# Patient Record
Sex: Female | Born: 1980 | Race: White | Hispanic: No | Marital: Married | Smoking: Never smoker
Health system: Southern US, Community
[De-identification: ages and names within clinical notes are randomized; demographics above are authoritative.]

## PROBLEM LIST (undated history)

## (undated) DIAGNOSIS — E039 Hypothyroidism, unspecified: Secondary | ICD-10-CM

## (undated) DIAGNOSIS — G43909 Migraine, unspecified, not intractable, without status migrainosus: Secondary | ICD-10-CM

## (undated) DIAGNOSIS — I10 Essential (primary) hypertension: Secondary | ICD-10-CM

## (undated) DIAGNOSIS — E079 Disorder of thyroid, unspecified: Secondary | ICD-10-CM

## (undated) HISTORY — DX: Disorder of thyroid, unspecified: E07.9

## (undated) HISTORY — DX: Hypothyroidism, unspecified: E03.9

## (undated) HISTORY — DX: Essential (primary) hypertension: I10

## (undated) HISTORY — DX: Migraine, unspecified, not intractable, without status migrainosus: G43.909

## (undated) HISTORY — PX: TUBAL LIGATION: SHX77

---

## 2005-02-16 ENCOUNTER — Encounter: Payer: Self-pay | Admitting: Unknown Physician Specialty

## 2005-03-31 ENCOUNTER — Inpatient Hospital Stay: Payer: Self-pay

## 2005-04-01 ENCOUNTER — Other Ambulatory Visit: Payer: Self-pay

## 2005-04-18 ENCOUNTER — Ambulatory Visit: Payer: Self-pay | Admitting: Urology

## 2005-06-07 ENCOUNTER — Inpatient Hospital Stay: Payer: Self-pay

## 2005-09-24 ENCOUNTER — Emergency Department: Payer: Self-pay | Admitting: Unknown Physician Specialty

## 2010-01-30 ENCOUNTER — Emergency Department: Payer: Self-pay

## 2012-12-12 ENCOUNTER — Ambulatory Visit: Payer: Self-pay | Admitting: Cardiovascular Disease

## 2013-01-15 ENCOUNTER — Encounter: Payer: Self-pay | Admitting: Cardiovascular Disease

## 2013-01-15 ENCOUNTER — Ambulatory Visit (INDEPENDENT_AMBULATORY_CARE_PROVIDER_SITE_OTHER): Payer: Managed Care, Other (non HMO) | Admitting: Cardiovascular Disease

## 2013-01-15 VITALS — BP 140/92 | HR 87 | Ht 64.0 in | Wt 190.8 lb

## 2013-01-15 DIAGNOSIS — R0789 Other chest pain: Secondary | ICD-10-CM

## 2013-01-15 DIAGNOSIS — I10 Essential (primary) hypertension: Secondary | ICD-10-CM | POA: Insufficient documentation

## 2013-01-15 DIAGNOSIS — G43909 Migraine, unspecified, not intractable, without status migrainosus: Secondary | ICD-10-CM

## 2013-01-15 DIAGNOSIS — R079 Chest pain, unspecified: Secondary | ICD-10-CM

## 2013-01-15 MED ORDER — NITROGLYCERIN 0.4 MG SL SUBL: 0.4000 mg | SUBLINGUAL_TABLET | SUBLINGUAL | Status: DC | PRN

## 2013-01-15 NOTE — Assessment & Plan Note (Signed)
Blood pressure well controlled on metoprolol.

## 2013-01-15 NOTE — Assessment & Plan Note (Signed)
On Topamax and beta blocker

## 2013-01-15 NOTE — Progress Notes (Signed)
   Patient ID: Jacqueline Walsh, female    DOB: 1980-12-06, 32 y.o.   MRN: [id number]  HPI Comments: Jacqueline Walsh is a very pleasant 32 year old woman with no prior cardiac history, patient of Dr. Juanetta Gosling, history of hypertension, migraines, hypothyroidism who presents by referral for chest pain.  Jacqueline Walsh is the mother of 3 boys. Significant stress at home, very active at baseline. She reports having a pressure in her mediastinal area radiating to shoulders that occurs 2 or 3 times per week lasting 10 minutes at a time with intensity of 8-9/10. Symptoms can present at rest, sometimes with exertion. Sometimes in the daytime, sometimes at nighttime. Prior to 3-4 months ago, she had no symptoms. She has not tried any medications to relieve her pain. In general she is very active and is unable to reproduce her pain with palpation, or activity.  At work, she is able to sit and has developed symptoms.  She does have a history of migraine symptoms. Recently they have been relatively well controlled on Topamax. Blood pressures also been well-controlled.  She denies any heartburn type symptoms he does not seem to appreciate lots of belching or burning after eating.  EKG shows normal sinus rhythm with rate 87 beats per minute, no significant ST or T wave changes   Outpatient Encounter Prescriptions as of 01/15/2013  Medication Sig Dispense Refill  . levothyroxine (SYNTHROID, LEVOTHROID) 137 MCG tablet Take 137 mcg by mouth daily before breakfast.      . metoprolol tartrate (LOPRESSOR) 25 MG tablet Take 25 mg by mouth 2 (two) times daily.      Marland Kitchen topiramate (TOPAMAX) 25 MG capsule Take 25 mg by mouth 2 (two) times daily.         Review of Systems  Constitutional: Negative.   HENT: Negative.   Eyes: Negative.   Respiratory: Negative.   Cardiovascular: Negative.   Gastrointestinal: Negative.   Musculoskeletal: Negative.   Skin: Negative.   Neurological: Negative.   Psychiatric/Behavioral: Negative.   All  other systems reviewed and are negative.    BP 140/92  Pulse 87  Ht 5\' 4"  (1.626 m)  Wt 190 lb 12 oz (86.524 kg)  BMI 32.73 kg/m2   Physical Exam  Nursing note and vitals reviewed. Constitutional: She is oriented to person, place, and time. She appears well-developed and well-nourished.  HENT:  Head: Normocephalic.  Nose: Nose normal.  Mouth/Throat: Oropharynx is clear and moist.  Eyes: Conjunctivae are normal. Pupils are equal, round, and reactive to light.  Neck: Normal range of motion. Neck supple. No JVD present.  Cardiovascular: Normal rate, regular rhythm, S1 normal, S2 normal, normal heart sounds and intact distal pulses.  Exam reveals no gallop and no friction rub.   No murmur heard. Pulmonary/Chest: Effort normal and breath sounds normal. No respiratory distress. She has no wheezes. She has no rales. She exhibits no tenderness.  Abdominal: Soft. Bowel sounds are normal. She exhibits no distension. There is no tenderness.  Musculoskeletal: Normal range of motion. She exhibits no edema and no tenderness.  Lymphadenopathy:    She has no cervical adenopathy.  Neurological: She is alert and oriented to person, place, and time. Coordination normal.  Skin: Skin is warm and dry. No rash noted. No erythema.  Psychiatric: She has a normal mood and affect. Her behavior is normal. Judgment and thought content normal.    Assessment and Plan

## 2013-01-15 NOTE — Assessment & Plan Note (Addendum)
Etiology of her chest pain is not clear. She is young, no significant risk factors for coronary artery disease. She's a nonsmoker, nondiabetic with no family history. Symptoms seem to present predominantly at rest, or intense though relatively frequent. Given her lack of reproducible symptoms with exertion, lack of risk factors, less inclined to think that this is angina or ischemia. More concerned about spasm (GI or coronary), GERD or other GI pathology. I suggested we try various things for symptom relief first before testing.  I suggested she try omeprazole twice a day for several weeks, with extra H2 blockers such as Zantac when necessary for symptoms. I've given her a prescription for nitroglycerin and suggested she try a half pill when necessary for possible spasm, coronary or esophageal. If this makes her migraines worse, we have asked her to hold the nitroglycerin. Also suggested she try carbonated beverages for possible hiatal hernia to see if she is able to belch to relieve her symptoms. It nitroglycerin does help, potentially calcium channel blockers or long-acting nitrates could be used daily.  Other options include trying levsin for her esophageal dysmotility or spasm.  Available testing detailed to her include treadmill testing, CT scan of the chest, possibly GI referral for EGD. We have asked her to call our office after she has tried several things as detailed above to see if symptoms will resolve.

## 2013-01-15 NOTE — Patient Instructions (Addendum)
For chest pressure, Please start omeprazole two a day If symptoms get better, back down to one a day  Ok to take generic pepcid/zantac as well. Try carbonation  For possible hiatal hernia  For possible spasm, try NTG (nitro) 1/2 pill under the tongue for severe symptoms  If symptoms persist, call the office Monitor symptoms to see if they happen with exertion  Please call us if you have new issues that need to be addressed before your next appt.

## 2016-09-19 ENCOUNTER — Emergency Department: Payer: Self-pay

## 2016-09-19 ENCOUNTER — Encounter: Payer: Self-pay | Admitting: Emergency Medicine

## 2016-09-19 DIAGNOSIS — R1011 Right upper quadrant pain: Secondary | ICD-10-CM | POA: Insufficient documentation

## 2016-09-19 DIAGNOSIS — I10 Essential (primary) hypertension: Secondary | ICD-10-CM | POA: Insufficient documentation

## 2016-09-19 DIAGNOSIS — E039 Hypothyroidism, unspecified: Secondary | ICD-10-CM | POA: Insufficient documentation

## 2016-09-19 DIAGNOSIS — Z79899 Other long term (current) drug therapy: Secondary | ICD-10-CM | POA: Insufficient documentation

## 2016-09-19 DIAGNOSIS — R1110 Vomiting, unspecified: Secondary | ICD-10-CM | POA: Insufficient documentation

## 2016-09-19 DIAGNOSIS — R079 Chest pain, unspecified: Secondary | ICD-10-CM | POA: Insufficient documentation

## 2016-09-19 LAB — BASIC METABOLIC PANEL
Anion gap: 9 (ref 5–15)
BUN: 10 mg/dL (ref 6–20)
CO2: 23 mmol/L (ref 22–32)
Calcium: 8.5 mg/dL — ABNORMAL LOW (ref 8.9–10.3)
Chloride: 104 mmol/L (ref 101–111)
Creatinine, Ser: 0.66 mg/dL (ref 0.44–1.00)
GFR calc Af Amer: >60 mL/min (ref >60)
GFR calc non Af Amer: >60 mL/min (ref >60)
GLUCOSE: 128 mg/dL — AB (ref 65–99)
Potassium: 3.8 mmol/L (ref 3.5–5.1)
Sodium: 136 mmol/L (ref 135–145)

## 2016-09-19 LAB — CBC
HCT: 39.7 % (ref 35.0–47.0)
Hemoglobin: 13.7 g/dL (ref 12.0–16.0)
MCH: 28.9 pg (ref 26.0–34.0)
MCHC: 34.4 g/dL (ref 32.0–36.0)
MCV: 84.2 fL (ref 80.0–100.0)
Platelets: 305 10*3/uL (ref 150–440)
RBC: 4.72 MIL/uL (ref 3.80–5.20)
RDW: 13.5 % (ref 11.5–14.5)
WBC: 12.4 10*3/uL — ABNORMAL HIGH (ref 3.6–11.0)

## 2016-09-19 LAB — TROPONIN I: Troponin I: <0.03 ng/mL (ref <0.03)

## 2016-09-19 NOTE — ED Triage Notes (Signed)
Pt ambulatory to triage with steady gait with c/o mid chest pain radiating to her back x 1 hour. Pt reports vomiting and headache earlier today. Pt denies hx of the same. Pt alert and oriented x 4, respirations even and unlabored, skin warm and dry,.

## 2016-09-20 ENCOUNTER — Emergency Department: Payer: Self-pay

## 2016-09-20 ENCOUNTER — Emergency Department
Admission: EM | Admit: 2016-09-20 | Discharge: 2016-09-20 | Disposition: A | Discharge status: Home / Self Care | Payer: Self-pay | Attending: Emergency Medicine | Admitting: Emergency Medicine

## 2016-09-20 DIAGNOSIS — R1011 Right upper quadrant pain: Secondary | ICD-10-CM

## 2016-09-20 DIAGNOSIS — R079 Chest pain, unspecified: Secondary | ICD-10-CM

## 2016-09-20 DIAGNOSIS — R1110 Vomiting, unspecified: Secondary | ICD-10-CM

## 2016-09-20 LAB — TROPONIN I

## 2016-09-20 NOTE — ED Provider Notes (Signed)
[facility] Emergency Department Provider Note    First MD Initiated Contact with Patient 09/20/16 0236     (approximate)  I have reviewed the triage vital signs and the nursing notes.   HISTORY  Chief Complaint Chest Pain   HPI Jacqueline Walsh is a 36 y.o. female with below list of chronic medical conditions presents to the emergency department with acute onset of vomiting tonight followed by chest pain and headache. Patient denies any symptoms at present stating that her chest pain was centrally located and described as pressure.   Past Medical History:  Diagnosis Date  . Hypertension   . Hypothyroidism   . Migraine, unspecified, without mention of intractable migraine without mention of status migrainosus   . Thyroid disease     Patient Active Problem List   Diagnosis Date Noted  . Chest pressure 01/15/2013  . Essential hypertension 01/15/2013  . Migraines 01/15/2013    Past Surgical History:  Procedure Laterality Date  . TUBAL LIGATION      Prior to Admission medications   Medication Sig Start Date End Date Taking? Authorizing Provider  levothyroxine (SYNTHROID, LEVOTHROID) 137 MCG tablet Take 137 mcg by mouth daily before breakfast.    Historical Provider, MD  metoprolol tartrate (LOPRESSOR) 25 MG tablet Take 25 mg by mouth 2 (two) times daily.    Historical Provider, MD  nitroGLYCERIN (NITROSTAT) 0.4 MG SL tablet Place 1 tablet (0.4 mg total) under the tongue every 5 (five) minutes as needed for chest pain. 01/15/13   Antonieta Iba, MD  topiramate (TOPAMAX) 25 MG capsule Take 25 mg by mouth 2 (two) times daily.    Historical Provider, MD    Allergies Codeine  Family History  Problem Relation Age of Onset  . Hypertension Mother   . Hyperlipidemia Mother     Social History Social History  Substance Use Topics  . Smoking status: Never Smoker  . Smokeless tobacco: Never Used  . Alcohol use No     Comment: occas    Review of  Systems Constitutional: No fever/chills Eyes: No visual changes. ENT: No sore throat. Cardiovascular: Positive for chest pain. Respiratory: Denies shortness of breath. Gastrointestinal: No abdominal pain.  No nausea, positive for vomiting Genitourinary: Negative for dysuria. Musculoskeletal: Negative for back pain. Skin: Negative for rash. Neurological: Negative for headaches, focal weakness or numbness.  10-point ROS otherwise negative.  ____________________________________________   PHYSICAL EXAM:  VITAL SIGNS: ED Triage Vitals [09/19/16 2320]  Enc Vitals Group     BP      Pulse      Resp      Temp      Temp src      SpO2      Weight 190 lb (86.2 kg)     Height 5\' 4"  (1.626 m)     Head Circumference      Peak Flow      Pain Score 8     Pain Loc      Pain Edu?      Excl. in GC?     Constitutional: Alert and oriented. Well appearing and in no acute distress. Eyes: Conjunctivae are normal. PERRL. EOMI. Head: Atraumatic. Mouth/Throat: Mucous membranes are moist.  Oropharynx non-erythematous. Neck: No stridor.   Cardiovascular: Normal rate, regular rhythm. Good peripheral circulation. Grossly normal heart sounds. Respiratory: Normal respiratory effort.  No retractions. Lungs CTAB. Gastrointestinal: Soft and nontender. No distention.  Musculoskeletal: No lower extremity tenderness nor edema. No gross  deformities of extremities. Neurologic:  Normal speech and language. No gross focal neurologic deficits are appreciated.  Skin:  Skin is warm, dry and intact. No rash noted. Psychiatric: Mood and affect are normal. Speech and behavior are normal.  ____________________________________________   LABS (all labs ordered are listed, but only abnormal results are displayed)  Labs Reviewed  BASIC METABOLIC PANEL - Abnormal; Notable for the following:       Result Value   Glucose, Bld 128 (*)    Calcium 8.5 (*)    All other components within normal limits  CBC - Abnormal;  Notable for the following:    WBC 12.4 (*)    All other components within normal limits  TROPONIN I  TROPONIN I   ____________________________________________  EKG  ED ECG REPORT I, Section N Elvert Cumpton, the attending physician, personally viewed and interpreted this ECG.   Date: 09/20/2016  EKG Time: 11:19 PM   Rate: 89  Rhythm: Normal sinus rhythm  Axis: Normal  Intervals: Normal  ST&T Change: None  ____________________________________________  RADIOLOGY I, Sharon N Prophet Renwick, personally viewed and evaluated these images (plain radiographs) as part of my medical decision making, as well as reviewing the written report by the radiologist.  Dg Chest 2 View  Result Date: 09/19/2016 CLINICAL DATA:  Acute onset of mid chest pain, radiating to the back. Vomiting and headache. Initial encounter. EXAM: CHEST  2 VIEW COMPARISON:  Chest radiograph performed 01/30/2010 FINDINGS: The lungs are well-aerated and clear. There is no evidence of focal opacification, pleural effusion or pneumothorax. The heart is normal in size; the mediastinal contour is within normal limits. No acute osseous abnormalities are seen. IMPRESSION: No acute cardiopulmonary process seen. Electronically Signed   By: Roanna RaiderJeffery  Chang M.D.   On: 09/19/2016 23:42      Procedures      INITIAL IMPRESSION / ASSESSMENT AND PLAN / ED COURSE  Pertinent labs & imaging results that were available during my care of the patient were reviewed by me and considered in my medical decision making (see chart for details).  36 year old female presenting with chest pain following vomiting. EKG revealed no evidence of ST segment elevation or depression. Troponin negative 2 laboratory data only remarkable for an elevated white count 12.4 ultrasound of the gallbladder revealed no gross pathology. Patient's chest pain possibly secondary to gastric contents irritating esophagus.      ____________________________________________  FINAL  CLINICAL IMPRESSION(S) / ED DIAGNOSES  Final diagnoses:  Vomiting  RUQ pain  Chest pain, unspecified type     MEDICATIONS GIVEN DURING THIS VISIT:  Medications - No data to display   NEW OUTPATIENT MEDICATIONS STARTED DURING THIS VISIT:  New Prescriptions   No medications on file    Modified Medications   No medications on file    Discontinued Medications   No medications on file     Note:  This document was prepared using Dragon voice recognition software and may include unintentional dictation errors.    Darci Currentandolph N Othel Hoogendoorn, MD 09/20/16 [phone]

## 2016-09-20 NOTE — ED Notes (Signed)
Pt. States headache, vomiting, then stabbing chest pain in central chest, radiating to back.

## 2016-09-20 NOTE — ED Notes (Signed)
MD Manson PasseyBrown at bedside,

## 2017-02-22 ENCOUNTER — Ambulatory Visit: Payer: Self-pay | Admitting: Family Medicine

## 2018-01-07 ENCOUNTER — Emergency Department: Payer: Medicaid Other

## 2018-01-07 ENCOUNTER — Emergency Department: Payer: Medicaid Other | Admitting: Registered Nurse

## 2018-01-07 ENCOUNTER — Encounter: Payer: Self-pay | Admitting: Emergency Medicine

## 2018-01-07 ENCOUNTER — Encounter
Admission: EM | Disposition: A | Discharge status: Home / Self Care | Payer: Self-pay | Source: Home / Self Care | Attending: Emergency Medicine

## 2018-01-07 ENCOUNTER — Observation Stay
Admission: EM | Admit: 2018-01-07 | Discharge: 2018-01-08 | Disposition: A | Discharge status: Home / Self Care | Payer: Medicaid Other | Attending: Surgery | Admitting: Surgery

## 2018-01-07 ENCOUNTER — Other Ambulatory Visit: Payer: Self-pay

## 2018-01-07 DIAGNOSIS — K3533 Acute appendicitis with perforation and localized peritonitis, with abscess: Principal | ICD-10-CM | POA: Insufficient documentation

## 2018-01-07 DIAGNOSIS — K37 Unspecified appendicitis: Secondary | ICD-10-CM | POA: Diagnosis present

## 2018-01-07 DIAGNOSIS — I10 Essential (primary) hypertension: Secondary | ICD-10-CM | POA: Diagnosis not present

## 2018-01-07 DIAGNOSIS — Z79899 Other long term (current) drug therapy: Secondary | ICD-10-CM | POA: Insufficient documentation

## 2018-01-07 DIAGNOSIS — Z885 Allergy status to narcotic agent status: Secondary | ICD-10-CM | POA: Diagnosis not present

## 2018-01-07 DIAGNOSIS — K3580 Unspecified acute appendicitis: Secondary | ICD-10-CM | POA: Diagnosis present

## 2018-01-07 DIAGNOSIS — E039 Hypothyroidism, unspecified: Secondary | ICD-10-CM | POA: Insufficient documentation

## 2018-01-07 HISTORY — PX: LAPAROSCOPIC APPENDECTOMY: SHX408

## 2018-01-07 LAB — CBC
HCT: 38.2 % (ref 35.0–47.0)
Hemoglobin: 13.2 g/dL (ref 12.0–16.0)
MCH: 29.6 pg (ref 26.0–34.0)
MCHC: 34.6 g/dL (ref 32.0–36.0)
MCV: 85.6 fL (ref 80.0–100.0)
PLATELETS: 260 10*3/uL (ref 150–440)
RBC: 4.46 MIL/uL (ref 3.80–5.20)
RDW: 13.7 % (ref 11.5–14.5)
WBC: 9.3 10*3/uL (ref 3.6–11.0)

## 2018-01-07 LAB — URINALYSIS, COMPLETE (UACMP) WITH MICROSCOPIC
Bilirubin Urine: NEGATIVE
GLUCOSE, UA: NEGATIVE mg/dL
KETONES UR: NEGATIVE mg/dL
NITRITE: NEGATIVE
PH: 8 (ref 5.0–8.0)
Protein, ur: 100 mg/dL — AB
Specific Gravity, Urine: 1.003 — ABNORMAL LOW (ref 1.005–1.030)

## 2018-01-07 LAB — COMPREHENSIVE METABOLIC PANEL
ALT: 15 U/L (ref 14–54)
AST: 22 U/L (ref 15–41)
Albumin: 4.2 g/dL (ref 3.5–5.0)
Alkaline Phosphatase: 79 U/L (ref 38–126)
Anion gap: 8 (ref 5–15)
BILIRUBIN TOTAL: 1.1 mg/dL (ref 0.3–1.2)
BUN: 9 mg/dL (ref 6–20)
CALCIUM: 9.1 mg/dL (ref 8.9–10.3)
CO2: 28 mmol/L (ref 22–32)
CREATININE: 0.65 mg/dL (ref 0.44–1.00)
Chloride: 102 mmol/L (ref 101–111)
Glucose, Bld: 91 mg/dL (ref 65–99)
Potassium: 3.6 mmol/L (ref 3.5–5.1)
Sodium: 138 mmol/L (ref 135–145)
TOTAL PROTEIN: 8.1 g/dL (ref 6.5–8.1)

## 2018-01-07 LAB — PREGNANCY, URINE: Preg Test, Ur: NEGATIVE

## 2018-01-07 LAB — LIPASE, BLOOD: Lipase: 24 U/L (ref 11–51)

## 2018-01-07 SURGERY — APPENDECTOMY, LAPAROSCOPIC
Anesthesia: General

## 2018-01-07 MED ORDER — HYDROMORPHONE HCL 1 MG/ML IJ SOLN
INTRAMUSCULAR | Status: DC | PRN
  Administered 2018-01-07 (×2): 0.5 mg via INTRAVENOUS

## 2018-01-07 MED ORDER — SUCCINYLCHOLINE CHLORIDE 20 MG/ML IJ SOLN
INTRAMUSCULAR | Status: AC
  Filled 2018-01-07: qty 1

## 2018-01-07 MED ORDER — KETOROLAC TROMETHAMINE 30 MG/ML IJ SOLN
30.0000 mg | Freq: Once | INTRAMUSCULAR | Status: AC
  Administered 2018-01-07: 30 mg via INTRAVENOUS

## 2018-01-07 MED ORDER — BUPIVACAINE-EPINEPHRINE (PF) 0.25% -1:200000 IJ SOLN
INTRAMUSCULAR | Status: AC
  Filled 2018-01-07: qty 30

## 2018-01-07 MED ORDER — OXYCODONE HCL 5 MG PO TABS
5.0000 mg | ORAL_TABLET | ORAL | Status: DC | PRN
  Administered 2018-01-08: 5 mg via ORAL
  Filled 2018-01-07: qty 2

## 2018-01-07 MED ORDER — KETOROLAC TROMETHAMINE 30 MG/ML IJ SOLN
INTRAMUSCULAR | Status: AC
  Filled 2018-01-07: qty 1

## 2018-01-07 MED ORDER — ONDANSETRON HCL 4 MG/2ML IJ SOLN
4.0000 mg | Freq: Once | INTRAMUSCULAR | Status: AC | PRN
  Administered 2018-01-07: 4 mg via INTRAVENOUS

## 2018-01-07 MED ORDER — ONDANSETRON HCL 4 MG/2ML IJ SOLN
INTRAMUSCULAR | Status: DC | PRN
  Administered 2018-01-07: 4 mg via INTRAVENOUS

## 2018-01-07 MED ORDER — ONDANSETRON HCL 4 MG/2ML IJ SOLN
4.0000 mg | Freq: Four times a day (QID) | INTRAMUSCULAR | Status: DC | PRN
Start: 2018-01-07 — End: 2018-01-08
  Filled 2018-01-07 (×2): qty 2

## 2018-01-07 MED ORDER — PROPOFOL 10 MG/ML IV BOLUS
INTRAVENOUS | Status: DC | PRN
  Administered 2018-01-07: 180 mg via INTRAVENOUS

## 2018-01-07 MED ORDER — ONDANSETRON HCL 4 MG/2ML IJ SOLN
INTRAMUSCULAR | Status: AC
  Administered 2018-01-07: 4 mg via INTRAVENOUS
  Filled 2018-01-07: qty 2

## 2018-01-07 MED ORDER — PIPERACILLIN-TAZOBACTAM 3.375 G IVPB 30 MIN
3.3750 g | Freq: Once | INTRAVENOUS | Status: AC
  Administered 2018-01-07: 3.375 g via INTRAVENOUS
  Filled 2018-01-07 (×2): qty 50

## 2018-01-07 MED ORDER — PROPOFOL 10 MG/ML IV BOLUS
INTRAVENOUS | Status: AC
  Filled 2018-01-07: qty 20

## 2018-01-07 MED ORDER — ENOXAPARIN SODIUM 40 MG/0.4ML ~~LOC~~ SOLN
40.0000 mg | SUBCUTANEOUS | Status: DC
  Administered 2018-01-08: 40 mg via SUBCUTANEOUS
  Filled 2018-01-07: qty 0.4

## 2018-01-07 MED ORDER — DIPHENHYDRAMINE HCL 25 MG PO CAPS
25.0000 mg | ORAL_CAPSULE | Freq: Four times a day (QID) | ORAL | Status: DC | PRN
  Administered 2018-01-07: 25 mg via ORAL
  Filled 2018-01-07: qty 1

## 2018-01-07 MED ORDER — ONDANSETRON 4 MG PO TBDP: 4.0000 mg | ORAL_TABLET | Freq: Four times a day (QID) | ORAL | Status: DC | PRN

## 2018-01-07 MED ORDER — ONDANSETRON HCL 4 MG/2ML IJ SOLN
INTRAMUSCULAR | Status: AC
  Filled 2018-01-07: qty 2

## 2018-01-07 MED ORDER — FENTANYL CITRATE (PF) 100 MCG/2ML IJ SOLN
INTRAMUSCULAR | Status: AC
  Filled 2018-01-07: qty 2

## 2018-01-07 MED ORDER — SODIUM CHLORIDE 0.9 % IV SOLN
2.0000 g | INTRAVENOUS | Status: DC
  Administered 2018-01-07: 2 g via INTRAVENOUS
  Filled 2018-01-07: qty 20
  Filled 2018-01-07: qty 2

## 2018-01-07 MED ORDER — SODIUM CHLORIDE 0.9 % IV BOLUS
500.0000 mL | Freq: Once | INTRAVENOUS | Status: AC
  Administered 2018-01-07: 500 mL via INTRAVENOUS

## 2018-01-07 MED ORDER — ONDANSETRON HCL 4 MG/2ML IJ SOLN
4.0000 mg | Freq: Once | INTRAMUSCULAR | Status: AC
  Administered 2018-01-07: 4 mg via INTRAVENOUS

## 2018-01-07 MED ORDER — PROCHLORPERAZINE EDISYLATE 10 MG/2ML IJ SOLN
10.0000 mg | Freq: Four times a day (QID) | INTRAMUSCULAR | Status: DC | PRN
  Administered 2018-01-07: 10 mg via INTRAVENOUS
  Filled 2018-01-07: qty 2

## 2018-01-07 MED ORDER — MIDAZOLAM HCL 2 MG/2ML IJ SOLN
INTRAMUSCULAR | Status: DC | PRN
  Administered 2018-01-07: 2 mg via INTRAVENOUS

## 2018-01-07 MED ORDER — HYDROMORPHONE HCL 1 MG/ML IJ SOLN: 0.5000 mg | INTRAMUSCULAR | Status: DC | PRN

## 2018-01-07 MED ORDER — ROCURONIUM BROMIDE 100 MG/10ML IV SOLN
INTRAVENOUS | Status: DC | PRN
  Administered 2018-01-07: 30 mg via INTRAVENOUS

## 2018-01-07 MED ORDER — FENTANYL CITRATE (PF) 100 MCG/2ML IJ SOLN
25.0000 ug | INTRAMUSCULAR | Status: AC | PRN
Start: 2018-01-07 — End: 2018-01-07
  Administered 2018-01-07 (×6): 25 ug via INTRAVENOUS

## 2018-01-07 MED ORDER — SUCCINYLCHOLINE CHLORIDE 20 MG/ML IJ SOLN
INTRAMUSCULAR | Status: DC | PRN
  Administered 2018-01-07: 80 mg via INTRAVENOUS

## 2018-01-07 MED ORDER — LIDOCAINE HCL (CARDIAC) PF 100 MG/5ML IV SOSY
PREFILLED_SYRINGE | INTRAVENOUS | Status: DC | PRN
  Administered 2018-01-07: 80 mg via INTRAVENOUS

## 2018-01-07 MED ORDER — METRONIDAZOLE IN NACL 5-0.79 MG/ML-% IV SOLN
500.0000 mg | Freq: Three times a day (TID) | INTRAVENOUS | Status: DC
  Administered 2018-01-08 (×2): 500 mg via INTRAVENOUS
  Filled 2018-01-07 (×4): qty 100

## 2018-01-07 MED ORDER — PANTOPRAZOLE SODIUM 40 MG PO TBEC
40.0000 mg | DELAYED_RELEASE_TABLET | Freq: Every day | ORAL | Status: DC
  Administered 2018-01-08: 40 mg via ORAL
  Filled 2018-01-07: qty 1

## 2018-01-07 MED ORDER — DEXAMETHASONE SODIUM PHOSPHATE 10 MG/ML IJ SOLN
INTRAMUSCULAR | Status: DC | PRN
  Administered 2018-01-07: 10 mg via INTRAVENOUS

## 2018-01-07 MED ORDER — LIDOCAINE HCL (PF) 2 % IJ SOLN
INTRAMUSCULAR | Status: AC
  Filled 2018-01-07: qty 10

## 2018-01-07 MED ORDER — MIDAZOLAM HCL 2 MG/2ML IJ SOLN
INTRAMUSCULAR | Status: AC
  Filled 2018-01-07: qty 2

## 2018-01-07 MED ORDER — ONDANSETRON HCL 4 MG/2ML IJ SOLN
INTRAMUSCULAR | Status: AC
Start: 2018-01-07 — End: 2018-01-08
  Filled 2018-01-07: qty 2

## 2018-01-07 MED ORDER — FENTANYL CITRATE (PF) 100 MCG/2ML IJ SOLN
INTRAMUSCULAR | Status: AC
  Administered 2018-01-07: 25 ug via INTRAVENOUS
  Filled 2018-01-07: qty 2

## 2018-01-07 MED ORDER — ROCURONIUM BROMIDE 100 MG/10ML IV SOLN
INTRAVENOUS | Status: AC
  Filled 2018-01-07: qty 1

## 2018-01-07 MED ORDER — SODIUM CHLORIDE 0.9 % IV BOLUS
1000.0000 mL | Freq: Once | INTRAVENOUS | Status: AC
  Administered 2018-01-07: 450 mL via INTRAVENOUS
  Administered 2018-01-07: 1000 mL via INTRAVENOUS

## 2018-01-07 MED ORDER — FENTANYL CITRATE (PF) 100 MCG/2ML IJ SOLN
INTRAMUSCULAR | Status: DC | PRN
  Administered 2018-01-07: 100 ug via INTRAVENOUS

## 2018-01-07 MED ORDER — DEXAMETHASONE SODIUM PHOSPHATE 10 MG/ML IJ SOLN
INTRAMUSCULAR | Status: AC
  Filled 2018-01-07: qty 1

## 2018-01-07 MED ORDER — SUGAMMADEX SODIUM 200 MG/2ML IV SOLN
INTRAVENOUS | Status: DC | PRN
  Administered 2018-01-07: 2 mg via INTRAVENOUS

## 2018-01-07 MED ORDER — HYDROMORPHONE HCL 1 MG/ML IJ SOLN
INTRAMUSCULAR | Status: AC
  Filled 2018-01-07: qty 1

## 2018-01-07 MED ORDER — LACTATED RINGERS IV SOLN
INTRAVENOUS | Status: DC
  Administered 2018-01-07: 23:00:00 via INTRAVENOUS

## 2018-01-07 MED ORDER — MORPHINE SULFATE (PF) 4 MG/ML IV SOLN: 2.0000 mg | INTRAVENOUS | Status: DC | PRN

## 2018-01-07 MED ORDER — BUPIVACAINE-EPINEPHRINE 0.25% -1:200000 IJ SOLN
INTRAMUSCULAR | Status: DC | PRN
  Administered 2018-01-07: 30 mL

## 2018-01-07 MED ORDER — KETOROLAC TROMETHAMINE 30 MG/ML IJ SOLN
30.0000 mg | Freq: Four times a day (QID) | INTRAMUSCULAR | Status: DC | PRN
  Administered 2018-01-08: 30 mg via INTRAVENOUS
  Filled 2018-01-07: qty 1

## 2018-01-07 MED ORDER — MORPHINE SULFATE (PF) 4 MG/ML IV SOLN
4.0000 mg | Freq: Once | INTRAVENOUS | Status: AC
  Administered 2018-01-07: 4 mg via INTRAVENOUS
  Filled 2018-01-07: qty 1

## 2018-01-07 MED ORDER — ACETAMINOPHEN 500 MG PO TABS
1000.0000 mg | ORAL_TABLET | Freq: Four times a day (QID) | ORAL | Status: DC
  Administered 2018-01-08 (×2): 1000 mg via ORAL
  Filled 2018-01-07 (×2): qty 2

## 2018-01-07 MED ORDER — SUGAMMADEX SODIUM 200 MG/2ML IV SOLN
INTRAVENOUS | Status: AC
  Filled 2018-01-07: qty 2

## 2018-01-07 SURGICAL SUPPLY — 37 items
APPLIER CLIP 5 13 M/L LIGAMAX5 (MISCELLANEOUS)
BLADE CLIPPER SURG (BLADE) ×3 IMPLANT
CANISTER SUCT 1200ML W/VALVE (MISCELLANEOUS) ×3 IMPLANT
CHLORAPREP W/TINT 26ML (MISCELLANEOUS) ×3 IMPLANT
CLIP APPLIE 5 13 M/L LIGAMAX5 (MISCELLANEOUS) IMPLANT
CUTTER FLEX LINEAR 45M (STAPLE) ×3 IMPLANT
DERMABOND ADVANCED (GAUZE/BANDAGES/DRESSINGS) ×2
DERMABOND ADVANCED .7 DNX12 (GAUZE/BANDAGES/DRESSINGS) ×1 IMPLANT
ELECT CAUTERY BLADE 6.4 (BLADE) ×3 IMPLANT
ELECT REM PT RETURN 9FT ADLT (ELECTROSURGICAL) ×3
ELECTRODE REM PT RTRN 9FT ADLT (ELECTROSURGICAL) ×1 IMPLANT
GLOVE BIO SURGEON STRL SZ7 (GLOVE) ×9 IMPLANT
GOWN STRL REUS W/ TWL LRG LVL3 (GOWN DISPOSABLE) ×2 IMPLANT
GOWN STRL REUS W/TWL LRG LVL3 (GOWN DISPOSABLE) ×4
IRRIGATION STRYKERFLOW (MISCELLANEOUS) ×1 IMPLANT
IRRIGATOR STRYKERFLOW (MISCELLANEOUS) ×3
IV NS 1000ML (IV SOLUTION) ×2
IV NS 1000ML BAXH (IV SOLUTION) ×1 IMPLANT
NEEDLE HYPO 22GX1.5 SAFETY (NEEDLE) ×3 IMPLANT
NS IRRIG 500ML POUR BTL (IV SOLUTION) ×3 IMPLANT
PACK LAP CHOLECYSTECTOMY (MISCELLANEOUS) ×3 IMPLANT
PENCIL ELECTRO HAND CTR (MISCELLANEOUS) ×3 IMPLANT
POUCH SPECIMEN RETRIEVAL 10MM (ENDOMECHANICALS) ×3 IMPLANT
RELOAD 45 VASCULAR/THIN (ENDOMECHANICALS) IMPLANT
RELOAD STAPLE TA45 3.5 REG BLU (ENDOMECHANICALS) ×3 IMPLANT
SCISSORS METZENBAUM CVD 33 (INSTRUMENTS) IMPLANT
SHEARS HARMONIC ACE PLUS 36CM (ENDOMECHANICALS) ×3 IMPLANT
SLEEVE ENDOPATH XCEL 5M (ENDOMECHANICALS) ×3 IMPLANT
SLEEVE SCD COMPRESS THIGH MED (MISCELLANEOUS) ×3 IMPLANT
SPONGE LAP 18X18 RF (DISPOSABLE) ×3 IMPLANT
SUT MNCRL AB 4-0 PS2 18 (SUTURE) ×3 IMPLANT
SUT VICRYL 0 AB UR-6 (SUTURE) ×6 IMPLANT
SYR 20CC LL (SYRINGE) ×3 IMPLANT
TRAY FOLEY MTR SLVR 16FR STAT (SET/KITS/TRAYS/PACK) IMPLANT
TROCAR XCEL BLUNT TIP 100MML (ENDOMECHANICALS) ×3 IMPLANT
TROCAR XCEL NON-BLD 5MMX100MML (ENDOMECHANICALS) ×3 IMPLANT
TUBING INSUF HEATED (TUBING) ×3 IMPLANT

## 2018-01-07 NOTE — Transfer of Care (Signed)
Immediate Anesthesia Transfer of Care Note  Patient: Jacqueline Walsh  Procedure(s) Performed: APPENDECTOMY LAPAROSCOPIC (N/A )  Patient Location: PACU  Anesthesia Type:General  Level of Consciousness: awake  Airway & Oxygen Therapy: Patient Spontanous Breathing  Post-op Assessment: Report given to RN  Post vital signs: stable  Last Vitals:  Vitals Value Taken Time  BP 149/98 01/07/2018  9:19 PM  Temp    Pulse 94 01/07/2018  9:20 PM  Resp 19 01/07/2018  9:20 PM  SpO2 99 % 01/07/2018  9:20 PM  Vitals shown include unvalidated device data.  Last Pain:  Vitals:   01/07/18 2010  TempSrc:   PainSc: 9          Complications: No apparent anesthesia complications

## 2018-01-07 NOTE — ED Notes (Signed)
Pt ambulatory to toilet. Will administer medications when pt returns.

## 2018-01-07 NOTE — ED Notes (Signed)
Pharmacy at bedside

## 2018-01-07 NOTE — ED Notes (Addendum)
Last eating/drinking 11:30 today.  Pt getting undressed at this time, husband at bedside.

## 2018-01-07 NOTE — ED Triage Notes (Signed)
Pt reports that she developed mid lower abd pain that she has been nauseated with since yesterday. Denies any V/D. States that she took Ibuprofen but it has not helped.

## 2018-01-07 NOTE — ED Notes (Signed)
Pt c/o of RLQ abd pain and nausea. Denies vomiting or having diarrhea. Alert, oriented, appears uncomfortable. Symptoms began yesterday.

## 2018-01-07 NOTE — Anesthesia Preprocedure Evaluation (Signed)
Anesthesia Evaluation  Patient identified by MRN, date of birth, ID band Patient awake    Reviewed: Allergy & Precautions, NPO status , Patient's Chart, lab work & pertinent test results  History of Anesthesia Complications Negative for: history of anesthetic complications  Airway Mallampati: III       Dental   Pulmonary neg sleep apnea, neg COPD,           Cardiovascular hypertension (non-compliant with meds), (-) Past MI and (-) CHF (-) dysrhythmias (-) Valvular Problems/Murmurs     Neuro/Psych neg Seizures    GI/Hepatic Neg liver ROS, neg GERD  ,  Endo/Other  neg diabetesHypothyroidism   Renal/GU negative Renal ROS     Musculoskeletal   Abdominal   Peds  Hematology   Anesthesia Other Findings   Reproductive/Obstetrics                             Anesthesia Physical Anesthesia Plan  ASA: II and emergent  Anesthesia Plan:    Post-op Pain Management:    Induction:   PONV Risk Score and Plan: 2 and Ondansetron and Dexamethasone  Airway Management Planned: Oral ETT  Additional Equipment:   Intra-op Plan:   Post-operative Plan:   Informed Consent: I have reviewed the patients History and Physical, chart, labs and discussed the procedure including the risks, benefits and alternatives for the proposed anesthesia with the patient or authorized representative who has indicated his/her understanding and acceptance.     Plan Discussed with:   Anesthesia Plan Comments:         Anesthesia Quick Evaluation

## 2018-01-07 NOTE — Op Note (Signed)
laparascopic appendectomy   [name] Date of operation:  01/07/2018  Indications: The patient presented with a history of  abdominal pain. Workup has revealed findings consistent with acute appendicitis.  Pre-operative Diagnosis: Acute appendicitis without mention of peritonitis  Post-operative Diagnosis: Same  Surgeon: Sterling Big, MD, FACS  Anesthesia: General with endotracheal tube  Findings: Acute non perforated appendicitis  Estimated Blood Loss: 10cc         Specimens: appendix         Complications:  none  Procedure Details  The patient was seen again in the preop area. The options of surgery versus observation were reviewed with the patient and/or family. The risks of bleeding, infection, recurrence of symptoms, negative laparoscopy, potential for an open procedure, bowel injury, abscess or infection, were all reviewed as well. The patient was taken to Operating Room, identified as Jacqueline Walsh and the procedure verified as laparoscopic appendectomy. A Time Out was held and the above information confirmed.  The patient was placed in the supine position and general anesthesia was induced.  Antibiotic prophylaxis was administered and VT E prophylaxis was in place. A Foley catheter was placed by the nursing staff.   The abdomen was prepped and draped in a sterile fashion. An infraumbilical incision was made. A cutdown technique was used to enter the abdominal cavity. Two vicryl stitches were placed on the fascia and a Hasson trocar inserted. Pneumoperitoneum obtained. Two 5 mm ports were placed under direct visualization.   The appendix was identified and found to be acutely inflamed  The appendix was carefully dissected. The mesoappendix was divided withHarmonic scalpel. The base of the appendix was dissected out and divided with a standard load Endo GIA.The appendix was placed in a Endo Catch bag and removed via the Hasson port. The right lower quadrant and pelvis was then  irrigated with  normal saline which was aspirated. Inspection  failed to identify any additional bleeding and there were no signs of bowel injury. Again the right lower quadrant was inspected there was no sign of bleeding or bowel injury therefore pneumoperitoneum was released, all ports were removed.  The umbilical fascia was closed with 0 Vicryl interrupted sutures and the skin incisions were approximated with subcuticular 4-0 Monocryl. Dermabond was placed The patient tolerated the procedure well, there were no complications. The sponge lap and needle count were correct at the end of the procedure.  The patient was taken to the recovery room in stable condition to be admitted for continued care.    Sterling Big, MD FACS

## 2018-01-07 NOTE — ED Provider Notes (Addendum)
[facility] Emergency Department Provider Note  ____________________________________________   I have reviewed the triage vital signs and the nursing notes. Where available I have reviewed prior notes and, if possible and indicated, outside hospital notes.    HISTORY  Chief Complaint Abdominal Pain    HPI Jacqueline Walsh is a 37 y.o. female history of hypertension, thyroid disease, migraines, as well as kidney stones in the past presents today with gradual onset right sided abdominal pain which radiates down towards her groin.  Nothing makes it better nothing makes worse nausea but no vomiting no fever no chills no dysuria no diarrhea no hematuria.  She had a BTL she states approximately eyes pregnancy.  Patient has had no vaginal discharge, she states that she has had any stones before which felt similar but not quite exactly like this".  She denies any other alleviating or aggravating symptoms no other antecedent treatment.  Describes the pain is significant.     Past Medical History:  Diagnosis Date  . Hypertension   . Hypothyroidism   . Migraine, unspecified, without mention of intractable migraine without mention of status migrainosus   . Thyroid disease     Patient Active Problem List   Diagnosis Date Noted  . Chest pressure 01/15/2013  . Essential hypertension 01/15/2013  . Migraines 01/15/2013    Past Surgical History:  Procedure Laterality Date  . TUBAL LIGATION      Prior to Admission medications   Medication Sig Start Date End Date Taking? Authorizing Provider  levothyroxine (SYNTHROID, LEVOTHROID) 137 MCG tablet Take 137 mcg by mouth daily before breakfast.    [provider]  metoprolol tartrate (LOPRESSOR) 25 MG tablet Take 25 mg by mouth 2 (two) times daily.    [provider]  nitroGLYCERIN (NITROSTAT) 0.4 MG SL tablet Place 1 tablet (0.4 mg total) under the tongue every 5 (five) minutes as needed for chest pain.  01/15/13   Antonieta Iba, MD  topiramate (TOPAMAX) 25 MG capsule Take 25 mg by mouth 2 (two) times daily.    [provider]    Allergies Codeine  Family History  Problem Relation Age of Onset  . Hypertension Mother   . Hyperlipidemia Mother     Social History Social History   Tobacco Use  . Smoking status: Never Smoker  . Smokeless tobacco: Never Used  Substance Use Topics  . Alcohol use: No    Comment: occas  . Drug use: No    Review of Systems Constitutional: No fever/chills Eyes: No visual changes. ENT: No sore throat. No stiff neck no neck pain Cardiovascular: Denies chest pain. Respiratory: Denies shortness of breath. Gastrointestinal:   no vomiting.  No diarrhea.  No constipation. Genitourinary: Negative for dysuria. Musculoskeletal: Negative lower extremity swelling Skin: Negative for rash. Neurological: Negative for severe headaches, focal weakness or numbness.   ____________________________________________   PHYSICAL EXAM:  VITAL SIGNS: ED Triage Vitals  Enc Vitals Group     BP 01/07/18 1633 (!) 187/108     Pulse Rate 01/07/18 1633 94     Resp 01/07/18 1633 20     Temp 01/07/18 1633 98.6 F (37 C)     Temp Source 01/07/18 1633 Oral     SpO2 01/07/18 1633 98 %     Weight 01/07/18 1634 190 lb (86.2 kg)     Height 01/07/18 1634  (1.626 m)     Head Circumference --      Peak Flow --  Pain Score 01/07/18 1633 9     Pain Loc --      Pain Edu? --      Excl. in GC? --     Constitutional: Alert and oriented. Well appearing and in no acute distress. Eyes: Conjunctivae are normal Head: Atraumatic HEENT: No congestion/rhinnorhea. Mucous membranes are moist.  Oropharynx non-erythematous Neck:   Nontender with no meningismus, no masses, no stridor Cardiovascular: Normal rate, regular rhythm. Grossly normal heart sounds.  Good peripheral circulation. Respiratory: Normal respiratory effort.  No retractions. Lungs CTAB. Abdominal:  Soft and right-sided mid to lower abdominal discomfort. No distention. No guarding no rebound Back:  There is no focal tenderness or step off.  there is no midline tenderness there are no lesions noted. there is no CVA tenderness  Musculoskeletal: No lower extremity tenderness, no upper extremity tenderness. No joint effusions, no DVT signs strong distal pulses no edema Neurologic:  Normal speech and language. No gross focal neurologic deficits are appreciated.  Skin:  Skin is warm, dry and intact. No rash noted. Psychiatric: Mood and affect are normal. Speech and behavior are normal.  ____________________________________________   LABS (all labs ordered are listed, but only abnormal results are displayed)  Labs Reviewed  URINALYSIS, COMPLETE (UACMP) WITH MICROSCOPIC - Abnormal; Notable for the following components:      Result Value   Color, Urine RED (*)    APPearance CLOUDY (*)    Specific Gravity, Urine 1.003 (*)    Hgb urine dipstick LARGE (*)    Protein, ur 100 (*)    Leukocytes, UA MODERATE (*)    Bacteria, UA MANY (*)    All other components within normal limits  LIPASE, BLOOD  COMPREHENSIVE METABOLIC PANEL  CBC  PREGNANCY, URINE  POC URINE PREG, ED    Pertinent labs  results that were available during my care of the patient were reviewed by me and considered in my medical decision making (see chart for details). ____________________________________________  EKG  I personally interpreted any EKGs ordered by me or triage  ____________________________________________  RADIOLOGY  Pertinent labs & imaging results that were available during my care of the patient were reviewed by me and considered in my medical decision making (see chart for details). If possible, patient and/or family made aware of any abnormal findings.  No results found. ____________________________________________    PROCEDURES  Procedure(s) performed: None  Procedures  Critical Care  performed: None  ____________________________________________   INITIAL IMPRESSION / ASSESSMENT AND PLAN / ED COURSE  Pertinent labs & imaging results that were available during my care of the patient were reviewed by me and considered in my medical decision making (see chart for details).  Patient here with right-sided fairly abrupt onset pain, history of kidney stones, positive hematuria although she is on her menses at this time.  We will start with a CT scan to rule out stone and reassess.  We will give her pain medications and IV fluid.  No vomiting but will give her nausea medication.  Appendicitis is considered, white count however is normal, no vomiting no fever  ----------------------------------------- 7:49 PM on 01/07/2018 -----------------------------------------  Findings consistent with appendicitis, we are giving Zosyn I have discussed with Dr. Elenor Legato of surgery they will come evaluate the patient.    ____________________________________________   FINAL CLINICAL IMPRESSION(S) / ED DIAGNOSES  Final diagnoses:  None      This chart was dictated using voice recognition software.  Despite best efforts to proofread,  errors can occur  which can change meaning.      Jeanmarie Plant, MD 01/07/18 1925    Jeanmarie Plant, MD 01/07/18 1949

## 2018-01-07 NOTE — H&P (Signed)
Patient ID: Jacqueline Walsh, female   DOB: [birth year], 37 y.o.   MRN: [id number]  HPI Jacqueline Walsh is a 37 y.o. female him into the emergency room with a 24-hour history of abdominal pain.  Pain is located in the right lower quadrant and radiates to right groin.  She reports that the pain is moderate to severe intensity, sharp and worsening when she moves.  He does have associated nausea and decreased appetite.  No emesis no fevers no chills. Only operation is tubal ligation.  She is able to perform more than 4 METS of activity without any shortness of breath or chest pain. Count is normal creatinine is normal CT scan personally reviewed there is evidence of dilated appendix with some inflammatory component.  There is no free air or abscess U preg Neg. Lytes nml. HPI  Past Medical History:  Diagnosis Date  . Hypertension   . Hypothyroidism   . Migraine, unspecified, without mention of intractable migraine without mention of status migrainosus   . Thyroid disease     Past Surgical History:  Procedure Laterality Date  . TUBAL LIGATION      Family History  Problem Relation Age of Onset  . Hypertension Mother   . Hyperlipidemia Mother     Social History Social History   Tobacco Use  . Smoking status: Never Smoker  . Smokeless tobacco: Never Used  Substance Use Topics  . Alcohol use: No    Comment: occas  . Drug use: No    Allergies  Allergen Reactions  . Codeine     Hives Breathing difficulty    Current Facility-Administered Medications  Medication Dose Route Frequency Provider Last Rate Last Dose  . morphine 4 MG/ML injection 4 mg  4 mg Intravenous Once Jeanmarie Plant, MD      . piperacillin-tazobactam (ZOSYN) IVPB 3.375 g  3.375 g Intravenous Once Ileana Roup A, MD      . sodium chloride 0.9 % bolus 1,000 mL  1,000 mL Intravenous Once Leonela Kivi, [place] F, MD      . sodium chloride 0.9 % bolus 500 mL  500 mL Intravenous Once Jeanmarie Plant, MD 492 mL/hr at 01/07/18 1912  500 mL at 01/07/18 1912   Current Outpatient Medications  Medication Sig Dispense Refill  . LEVOTHYROXINE SODIUM PO Take by mouth every day (Monday through Friday) and take by mouth daily Saturday and Sunday       Review of Systems Full ROS  was asked and was negative except for the information on the HPI  Physical Exam Blood pressure (!) 170/116, pulse 89, temperature 98.6 F (37 C), temperature source Oral, resp. rate 18, height  (1.626 m), weight 86.2 kg (190 lb), SpO2 99 %. CONSTITUTIONAL: NAD, EYES: Pupils are equal, round, and reactive to light, Sclera are non-icteric. EARS, NOSE, MOUTH AND THROAT: The oropharynx is clear. The oral mucosa is pink and moist. Hearing is intact to voice. LYMPH NODES:  Lymph nodes in the neck are normal. RESPIRATORY:  Lungs are clear. There is normal respiratory effort, with equal breath sounds bilaterally, and without pathologic use of accessory muscles. CARDIOVASCULAR: Heart is regular without murmurs, gallops, or rubs. GI: The abdomen is  Soft,TTP RLQ w some rebound. Focal peritonitis. GU: Rectal deferred.   MUSCULOSKELETAL: Normal muscle strength and tone. No cyanosis or edema.   SKIN: Turgor is good and there are no pathologic skin lesions or ulcers. NEUROLOGIC: Motor and sensation is grossly normal. Cranial nerves  are grossly intact. PSYCH:  Oriented to person, place and time. Affect is normal.  Data Reviewed  I have personally reviewed the patient's imaging, laboratory findings and medical records.    Assessment/Plan 37 year old female with acute abdominal pain consistent with acute appendicitis and confirmed by CT. Discussed with the patient detail about her disease process and I do recommend appendectomy. The risks, benefits, complications, treatment options, and expected outcomes were discussed with the patient. .  Also discussed continuing to the operating room for Laparoscopic Appendectomy.  The possibilities of   bleeding, recurrent infection, perforation of viscus, finding a normal appendix, the need for additional procedures, failure to diagnose a condition, conversion to open procedure and creating a complication requiring transfusion or further operations were discussed. The patient was given the opportunity to ask questions and have them answered.  Patient would like to proceed with Laparoscopic Appendectomy and consent was obtained.  We will go ahead and start broad-spectrum antibiotics, crystalloids and post her for an appendectomy tonight    Sterling Big, MD FACS General Surgeon 01/07/2018, 8:05 PM

## 2018-01-07 NOTE — Anesthesia Post-op Follow-up Note (Signed)
Anesthesia QCDR form completed.        

## 2018-01-07 NOTE — Anesthesia Postprocedure Evaluation (Signed)
Anesthesia Post Note  Patient: Jacqueline Walsh  Procedure(s) Performed: APPENDECTOMY LAPAROSCOPIC (N/A )  Patient location during evaluation: PACU Anesthesia Type: General Level of consciousness: awake and alert Pain management: pain level controlled Vital Signs Assessment: post-procedure vital signs reviewed and stable Respiratory status: spontaneous breathing and respiratory function stable Cardiovascular status: stable Anesthetic complications: no     Last Vitals:  Vitals:   01/07/18 2231 01/07/18 2300  BP: (!) 158/98 (!) 152/96  Pulse: 83 82  Resp: 19 18  Temp: 36.6 C 36.4 C  SpO2: 95% 96%    Last Pain:  Vitals:   01/07/18 2300  TempSrc: Oral  PainSc:                  Daquawn Seelman K

## 2018-01-07 NOTE — Anesthesia Procedure Notes (Signed)
Procedure Name: Intubation Date/Time: 01/07/2018 8:32 PM Performed by: Geraldine Contras, CRNA Pre-anesthesia Checklist: Patient identified, Emergency Drugs available, Suction available, Timeout performed and Patient being monitored Patient Re-evaluated:Patient Re-evaluated prior to induction Oxygen Delivery Method: Circle system utilized Preoxygenation: Pre-oxygenation with 100% oxygen Induction Type: IV induction Ventilation: Mask ventilation without difficulty Laryngoscope Size: Mac and 3 Grade View: Grade II Tube type: Oral Tube size: 7.0 mm Number of attempts: 2 Airway Equipment and Method: Stylet Placement Confirmation: positive ETCO2 and breath sounds checked- equal and bilateral Secured at: 22 cm Tube secured with: Tape Dental Injury: Teeth and Oropharynx as per pre-operative assessment

## 2018-01-07 NOTE — ED Notes (Signed)
Surgeon at bedside. Pt signed OR consent.

## 2018-01-08 ENCOUNTER — Encounter: Payer: Self-pay | Admitting: Surgery

## 2018-01-08 MED ORDER — OXYCODONE HCL 5 MG PO TABS: 5.0000 mg | ORAL_TABLET | ORAL | 0 refills | Status: DC | PRN

## 2018-01-08 NOTE — Discharge Instructions (Signed)
In addition to included general post-operative instructions for Laparoscopic Appendectomy,  Diet: Resume home heart healthy diet.   Activity: No heavy lifting >20 pounds (children, pets, laundry, garbage) or strenuous activity until follow-up, but light activity and walking are encouraged. Do not drive or drink alcohol if taking narcotic pain medications.  Wound care: 2 days after surgery (Friday, 5/24), you may shower/get incision wet with soapy water and pat dry (do not rub incisions), but no baths or submerging incision underwater until follow-up.   Medications: Resume all home medications. For mild to moderate pain: acetaminophen (Tylenol) or ibuprofen/naproxen (if no kidney disease). Combining Tylenol with alcohol can substantially increase your risk of causing liver disease. Narcotic pain medications, if prescribed, can be used for severe pain, though may cause nausea, constipation, and drowsiness. Do not combine Tylenol and Percocet (or similar) within a 6 hour period as Percocet (and similar) contain(s) Tylenol. If you do not need the narcotic pain medication, you do not need to fill the prescription.  Call office [phone]) at any time if any questions, worsening pain, fevers/chills, bleeding, drainage from incision site, or other concerns.

## 2018-01-08 NOTE — Progress Notes (Signed)
Patient cleared for discharge by Dr Earlene Plater. Education complete. AVS printed. Discharge instructions given. All questions answered for patient clarification.  Prescriptions given, pharmacy verified.  IV removed.  Discharged to home with husband Via POV

## 2018-01-09 LAB — SURGICAL PATHOLOGY

## 2018-01-09 LAB — HIV ANTIBODY (ROUTINE TESTING W REFLEX): HIV SCREEN 4TH GENERATION: NONREACTIVE

## 2018-01-20 DIAGNOSIS — E063 Autoimmune thyroiditis: Secondary | ICD-10-CM | POA: Insufficient documentation

## 2018-01-21 ENCOUNTER — Encounter: Payer: Self-pay | Admitting: Surgery

## 2018-01-21 ENCOUNTER — Ambulatory Visit (INDEPENDENT_AMBULATORY_CARE_PROVIDER_SITE_OTHER): Payer: Self-pay | Admitting: Surgery

## 2018-01-21 VITALS — BP 192/121 | HR 91 | Temp 98.4°F | Ht 64.0 in | Wt 185.0 lb

## 2018-01-21 DIAGNOSIS — Z09 Encounter for follow-up examination after completed treatment for conditions other than malignant neoplasm: Secondary | ICD-10-CM

## 2018-01-21 NOTE — Patient Instructions (Signed)
We scheduled an appointment with [facility]  due to your elevated blood pressure. Please see address below.  [facility]                                              [address], [place], [place] [id number]      GENERAL POST-OPERATIVE PATIENT INSTRUCTIONS   WOUND CARE INSTRUCTIONS:  Keep a dry clean dressing on the wound if there is drainage. The initial bandage may be removed after 24 hours.  Once the wound has quit draining you may leave it open to air.  If clothing rubs against the wound or causes irritation and the wound is not draining you may cover it with a dry dressing during the daytime.  Try to keep the wound dry and avoid ointments on the wound unless directed to do so.  If the wound becomes bright red and painful or starts to drain infected material that is not clear, please contact your physician immediately.  If the wound is mildly pink and has a thick firm ridge underneath it, this is normal, and is referred to as a healing ridge.  This will resolve over the next 4-6 weeks.  BATHING: You may shower if you have been informed of this by your surgeon. However, Please do not submerge in a tub, hot tub, or pool until incisions are completely sealed or have been told by your surgeon that you may do so.  DIET:  You may eat any foods that you can tolerate.  It is a good idea to eat a high fiber diet and take in plenty of fluids to prevent constipation.  If you do become constipated you may want to take a mild laxative or take ducolax tablets on a daily basis until your bowel habits are regular.  Constipation can be very uncomfortable, along with straining, after recent surgery.  ACTIVITY:  You are encouraged to cough and deep breath or use your incentive spirometer if you were given one, every 15-30 minutes when awake.  This will help prevent respiratory complications and low grade fevers post-operatively if you had a general anesthetic.  You may want to hug a  pillow when coughing and sneezing to add additional support to the surgical area, if you had abdominal or chest surgery, which will decrease pain during these times.  You are encouraged to walk and engage in light activity for the next two weeks.  You should not lift more than 20 pounds, until 02/18/2018 as it could put you at increased risk for complications.  Twenty pounds is roughly equivalent to a plastic bag of groceries. At that time- Listen to your body when lifting, if you have pain when lifting, stop and then try again in a few days. Soreness after doing exercises or activities of daily living is normal as you get back in to your normal routine.  MEDICATIONS:  Try to take narcotic medications and anti-inflammatory medications, such as tylenol, ibuprofen, naprosyn, etc., with food.  This will minimize stomach upset from the medication.  Should you develop nausea and vomiting from the pain medication, or develop a rash, please discontinue the medication and contact your physician.  You should not drive, make important decisions, or operate machinery when taking narcotic pain medication.  SUNBLOCK Use sun block to incision area over the next year if  this area will be exposed to sun. This helps decrease scarring and will allow you avoid a permanent darkened area over your incision.  QUESTIONS:  Please feel free to call our office if you have any questions, and we will be glad to assist you. [phone]

## 2018-01-22 ENCOUNTER — Encounter: Payer: Self-pay | Admitting: Surgery

## 2018-01-22 NOTE — Progress Notes (Signed)
S/p lap appy No complaints Taking PO, no fevers,  Minimal pain Main issue is HTN Path d/w pt  PE: NAD Abd: incisions c/d/i, soft nt  A/P Doing well No surgical issues Arrange PCP for HTN RTC prn No heavy lifting

## 2018-01-23 ENCOUNTER — Inpatient Hospital Stay: Payer: Self-pay | Admitting: Surgery

## 2018-02-17 ENCOUNTER — Other Ambulatory Visit: Payer: Self-pay

## 2018-02-17 ENCOUNTER — Ambulatory Visit: Payer: Self-pay | Admitting: Nurse Practitioner

## 2018-02-17 ENCOUNTER — Encounter: Payer: Self-pay | Admitting: Nurse Practitioner

## 2018-02-17 VITALS — BP 160/81 | HR 83 | Temp 98.7°F | Ht 64.0 in | Wt 187.2 lb

## 2018-02-17 DIAGNOSIS — E063 Autoimmune thyroiditis: Secondary | ICD-10-CM

## 2018-02-17 DIAGNOSIS — Z7689 Persons encountering health services in other specified circumstances: Secondary | ICD-10-CM

## 2018-02-17 DIAGNOSIS — I10 Essential (primary) hypertension: Secondary | ICD-10-CM

## 2018-02-17 MED ORDER — LISINOPRIL 10 MG PO TABS: 10.0000 mg | ORAL_TABLET | Freq: Every day | ORAL | 1 refills | Status: DC

## 2018-02-17 NOTE — Progress Notes (Signed)
Subjective:    Patient ID: Jacqueline EllisMegan B Archuleta, female    DOB: 28-Jun-1981, 37 y.o.   MRN: [id number]  Jacqueline Walsh is a 37 y.o. female presenting on 02/17/2018 for Establish Care and Hypertension   HPI Establish Care New Provider  Pt last seen by PCP [facility] in 2017 years ago.  [facility]    Hypertension  - She is not checking BP at home or outside of clinic.    - Last medications: lisinopril 10 mg once daily, tolerating well without side effects, but has been off for several months. - She is not currently symptomatic. - Pt denies headache, lightheadedness, dizziness, changes in vision, chest tightness/pressure, palpitations, leg swelling, sudden loss of speech or loss of consciousness. - She  reports no regular exercise routine. - Her diet is moderate in salt, moderate in fat, and moderate in carbohydrates.   Hypothyroidism - Pt states she is taking her levothyroxine 140 mcg M-female and 137 mcg on Sat-Sun in the am at least 1 hour before eating or drinking and taking other medicines.   - She is not currently symptomatic. - She denies, fatigue, excess energy, weight changes, heart racing, heart palpitations, heat and cold intolerance, changes in hair/skin/nails, and lower leg swelling.  - She does not have any compressive symptoms to include difficulty swallowing, globus sensation, or difficulty breathing when lying flat.   Past Medical History:  Diagnosis Date  . Hypertension   . Hypothyroidism   . Migraine, unspecified, without mention of intractable migraine without mention of status migrainosus   . Thyroid disease    Past Surgical History:  Procedure Laterality Date  . LAPAROSCOPIC APPENDECTOMY N/A 01/07/2018   Procedure: APPENDECTOMY LAPAROSCOPIC;  Surgeon: Leafy RoPabon, Diego F, MD;  Location: [facility] ORS;  Service: General;  Laterality: N/A;  . TUBAL LIGATION     Social History   Socioeconomic History  . Marital status: Married    Spouse  name: Not on file  . Number of children: Not on file  . Years of education: Not on file  . Highest education level: Not on file  Occupational History  . Not on file  Social Needs  . Financial resource strain: Not on file  . Food insecurity:    Worry: Not on file    Inability: Not on file  . Transportation needs:    Medical: Not on file    Non-medical: Not on file  Tobacco Use  . Smoking status: Never Smoker  . Smokeless tobacco: Never Used  Substance and Sexual Activity  . Alcohol use: No    Comment: occas  . Drug use: No  . Sexual activity: Yes  Lifestyle  . Physical activity:    Days per week: Not on file    Minutes per session: Not on file  . Stress: Not on file  Relationships  . Social connections:    Talks on phone: Not on file    Gets together: Not on file    Attends religious service: Not on file    Active member of club or organization: Not on file    Attends meetings of clubs or organizations: Not on file    Relationship status: Not on file  . Intimate partner violence:    Fear of current or ex partner: Not on file    Emotionally abused: Not on file    Physically abused: Not on file    Forced sexual activity: Not on file  Other Topics  Concern  . Not on file  Social History Narrative  . Not on file   Family History  Problem Relation Age of Onset  . Hypertension Mother   . Hyperlipidemia Mother   . Thyroid disease Father   . Thyroid disease Sister    Current Outpatient Medications on File Prior to Visit  Medication Sig  . LEVOTHYROXINE SODIUM PO Take by mouth every day (Monday through Friday) and take by mouth daily Saturday and Sunday  . Multiple Vitamin (MULTIVITAMIN) tablet Take 1 tablet by mouth daily.   No current facility-administered medications on file prior to visit.     Review of Systems  Constitutional: Negative for chills and fever.  HENT: Negative for congestion and sore throat.   Eyes: Negative for pain.  Respiratory:  Negative for cough, shortness of breath and wheezing.   Cardiovascular: Negative for chest pain, palpitations and leg swelling.  Gastrointestinal: Negative for abdominal pain, blood in stool, constipation, diarrhea, nausea and vomiting.  Endocrine: Negative for polydipsia.  Genitourinary: Negative for dysuria, frequency, hematuria and urgency.  Musculoskeletal: Negative for back pain, myalgias and neck pain.  Skin: Negative.  Negative for rash.  Allergic/Immunologic: Negative for environmental allergies.  Neurological: Negative for dizziness, weakness and headaches.  Hematological: Does not bruise/bleed easily.  Psychiatric/Behavioral: Negative for dysphoric mood and suicidal ideas. The patient is not nervous/anxious.    Per HPI unless specifically indicated above     Objective:    BP (!) 160/81 (BP Location: Right Arm, Patient Position: Sitting, Cuff Size: Normal)   Pulse 83   Temp 98.7 F (37.1 C) (Oral)   Ht 5\' 4"  (1.626 m)   Wt 187 lb 3.2 oz (84.9 kg)   LMP 02/03/2018   BMI 32.13 kg/m   Wt Readings from Last 3 Encounters:  02/17/18 187 lb 3.2 oz (84.9 kg)  01/21/18 185 lb (83.9 kg)  01/07/18 190 lb (86.2 kg)    Physical Exam  Constitutional: She is oriented to person, place, and time. She appears well-developed and well-nourished. No distress.  HENT:  Head: Normocephalic and atraumatic.  Neck: Normal range of motion. Neck supple. Carotid bruit is not present. No thyromegaly present.  Cardiovascular: Normal rate, regular rhythm, S1 normal, S2 normal, normal heart sounds and intact distal pulses.  Pulmonary/Chest: Effort normal and breath sounds normal. No respiratory distress.  Musculoskeletal: She exhibits no edema (pedal).  Neurological: She is alert and oriented to person, place, and time.  Skin: Skin is warm and dry. Capillary refill takes less than 2 seconds.  Psychiatric: She has a normal mood and affect. Her behavior is normal. Judgment and thought content normal.    Vitals reviewed.  Results for orders placed or performed in visit on 02/17/18  COMPLETE METABOLIC PANEL WITH GFR  Result Value Ref Range   Glucose, Bld 92 65 - 99 mg/dL   BUN 11 7 - 25 mg/dL   Creat 1.30 8.65 - 7.84 mg/dL   GFR, Est Non African American 108 > OR = 60 mL/min/1.73m2   GFR, Est African American 126 > OR = 60 mL/min/1.37m2   BUN/Creatinine Ratio NOT APPLICABLE 6 - 22 (calc)   Sodium 138 135 - 146 mmol/L   Potassium 4.0 3.5 - 5.3 mmol/L   Chloride 106 98 - 110 mmol/L   CO2 25 20 - 32 mmol/L   Calcium 8.9 8.6 - 10.2 mg/dL   Total Protein 7.0 6.1 - 8.1 g/dL   Albumin 4.5 3.6 - 5.1 g/dL  Globulin 2.5 1.9 - 3.7 g/dL (calc)   AG Ratio 1.8 1.0 - 2.5 (calc)   Total Bilirubin 0.7 0.2 - 1.2 mg/dL   Alkaline phosphatase (APISO) 75 33 - 115 U/L   AST 17 10 - 30 U/L   ALT 14 6 - 29 U/L  TSH + free T4  Result Value Ref Range   TSH W/REFLEX TO FT4 2.53 mIU/L      Assessment & Plan:   Problem List Items Addressed This Visit      Cardiovascular and Mediastinum   Essential hypertension - Primary    Uncontrolled, but is off medication.  Previously took lisinopril and tolerated well.  BP goal < 130/80.  Pt is not currently working on lifestyle modifications.  Taking medications tolerating well without side effects.   Plan: 1. Continue taking lisinopril 10 mg once daily 2. Obtain labs CMP, TSH today  3. Encouraged heart healthy diet and increasing exercise to 30 minutes most days of the week. 4. Check BP 1-2 x per week at home, keep log, and bring to clinic at next appointment. 5. Follow up 3 months.        Relevant Medications   lisinopril (PRINIVIL,ZESTRIL) 10 MG tablet   Other Relevant Orders   COMPLETE METABOLIC PANEL WITH GFR (Completed)     Endocrine   Hashimoto's disease    No recent check of hypothyroidism.  Stable at last labs.  Patient taking levothyroxine correctly.  Repeat labs today before refills provided.  Followup 3 months.      Relevant Orders   TSH  + free T4 (Completed)    Other Visit Diagnoses    Encounter to establish care         Previous PCP was at [facility]  Records are reviewed in CareEverywhere.  Past medical, family, and surgical history reviewed w/ patient in clinic today.   Meds ordered this encounter  Medications  . lisinopril (PRINIVIL,ZESTRIL) 10 MG tablet    Sig: Take 1 tablet (10 mg total) by mouth daily.    Dispense:  90 tablet    Refill:  1    Order Specific Question:   Supervising Provider    Answer:   Smitty Cords [[id number]]    Follow up plan: Return in about 3 months (around 05/20/2018) for AND in 2 weeks for BP check with CMA, hypertension.  Wilhelmina Mcardle, DNP, AGPCNP-BC Adult Gerontology Primary Care Nurse Practitioner [facility] 02/17/2018, 10:36 AM

## 2018-02-17 NOTE — Patient Instructions (Addendum)
Jacqueline Walsh,   Thank you for coming in to clinic today.  1. START lisinopril 10 mg once daily.  Discussed possible side effects of angioedema (rare), cough (common and reversible), kidney damage (rare, increase monitoring with start).  2. Reduce sodium/salt intake in your diet. - Increase your physical activity until you are increasing your heart rate for 30 minutes on most days of the week.  Please schedule a follow-up appointment with Wilhelmina Mcardle, AGNP. Return in about 3 months (around 05/20/2018) for AND in 2 weeks for BP check with CMA, hypertension.  If you have any other questions or concerns, please feel free to call the clinic or send a message through MyChart. You may also schedule an earlier appointment if necessary.  You will receive a survey after today's visit either digitally by e-mail or paper by [employer]. Your experiences and feedback matter to [place].  Please respond so we know how we are doing as we provide care for you.   Wilhelmina Mcardle, DNP, AGNP-BC Adult Gerontology Nurse Practitioner [facility], [facility]   Managing Your Hypertension Hypertension is commonly called high blood pressure. This is when the force of your blood pressing against the walls of your arteries is too strong. Arteries are blood vessels that carry blood from your heart throughout your body. Hypertension forces the heart to work harder to pump blood, and may cause the arteries to become narrow or stiff. Having untreated or uncontrolled hypertension can cause heart attack, stroke, kidney disease, and other problems. What are blood pressure readings? A blood pressure reading consists of a higher number over a lower number. Ideally, your blood pressure should be below 120/80. The first ("top") number is called the systolic pressure. It is a measure of the pressure in your arteries as your heart beats. The second ("bottom") number is called the diastolic pressure. It is a measure of the  pressure in your arteries as the heart relaxes. What does my blood pressure reading mean? Blood pressure is classified into four stages. Based on your blood pressure reading, your health care provider may use the following stages to determine what type of treatment you need, if any. Systolic pressure and diastolic pressure are measured in a unit called mm Hg. Normal  Systolic pressure: below 120.  Diastolic pressure: below 80. Elevated  Systolic pressure: 120-129.  Diastolic pressure: below 80. Hypertension stage 1  Systolic pressure: 130-139.  Diastolic pressure: 80-89. Hypertension stage 2  Systolic pressure: 140 or above.  Diastolic pressure: 90 or above. What health risks are associated with hypertension? Managing your hypertension is an important responsibility. Uncontrolled hypertension can lead to:  A heart attack.  A stroke.  A weakened blood vessel (aneurysm).  Heart failure.  Kidney damage.  Eye damage.  Metabolic syndrome.  Memory and concentration problems.  What changes can I make to manage my hypertension? Hypertension can be managed by making lifestyle changes and possibly by taking medicines. Your health care provider will help you make a plan to bring your blood pressure within a normal range. Eating and drinking  Eat a diet that is high in fiber and potassium, and low in salt (sodium), added sugar, and fat. An example eating plan is called the DASH (Dietary Approaches to Stop Hypertension) diet. To eat this way: ? Eat plenty of fresh fruits and vegetables. Try to fill half of your plate at each meal with fruits and vegetables. ? Eat whole grains, such as whole wheat pasta, brown rice, or  whole grain bread. Fill about one quarter of your plate with whole grains. ? Eat low-fat diary products. ? Avoid fatty cuts of meat, processed or cured meats, and poultry with skin. Fill about one quarter of your plate with lean proteins such as fish, chicken  without skin, beans, eggs, and tofu. ? Avoid premade and processed foods. These tend to be higher in sodium, added sugar, and fat.  Reduce your daily sodium intake. Most people with hypertension should eat less than 1,500 mg of sodium a day.  Limit alcohol intake to no more than 1 drink a day for nonpregnant women and 2 drinks a day for men. One drink equals 12 oz of beer, 5 oz of wine, or 1 oz of hard liquor. Lifestyle  Work with your health care provider to maintain a healthy body weight, or to lose weight. Ask what an ideal weight is for you.  Get at least 30 minutes of exercise that causes your heart to beat faster (aerobic exercise) most days of the week. Activities may include walking, swimming, or biking.  Include exercise to strengthen your muscles (resistance exercise), such as weight lifting, as part of your weekly exercise routine. Try to do these types of exercises for 30 minutes at least 3 days a week.  Do not use any products that contain nicotine or tobacco, such as cigarettes and e-cigarettes. If you need help quitting, ask your health care provider.  Control any long-term (chronic) conditions you have, such as high cholesterol or diabetes. Monitoring  Monitor your blood pressure at home as told by your health care provider. Your personal target blood pressure may vary depending on your medical conditions, your age, and other factors.  Have your blood pressure checked regularly, as often as told by your health care provider. Working with your health care provider  Review all the medicines you take with your health care provider because there may be side effects or interactions.  Talk with your health care provider about your diet, exercise habits, and other lifestyle factors that may be contributing to hypertension.  Visit your health care provider regularly. Your health care provider can help you create and adjust your plan for managing hypertension. Will I need  medicine to control my blood pressure? Your health care provider may prescribe medicine if lifestyle changes are not enough to get your blood pressure under control, and if:  Your systolic blood pressure is 130 or higher.  Your diastolic blood pressure is 80 or higher.  Take medicines only as told by your health care provider. Follow the directions carefully. Blood pressure medicines must be taken as prescribed. The medicine does not work as well when you skip doses. Skipping doses also puts you at risk for problems. Contact a health care provider if:  You think you are having a reaction to medicines you have taken.  You have repeated (recurrent) headaches.  You feel dizzy.  You have swelling in your ankles.  You have trouble with your vision. Get help right away if:  You develop a severe headache or confusion.  You have unusual weakness or numbness, or you feel faint.  You have severe pain in your chest or abdomen.  You vomit repeatedly.  You have trouble breathing. Summary  Hypertension is when the force of blood pumping through your arteries is too strong. If this condition is not controlled, it may put you at risk for serious complications.  Your personal target blood pressure may vary depending on your medical  conditions, your age, and other factors. For most people, a normal blood pressure is less than 120/80.  Hypertension is managed by lifestyle changes, medicines, or both. Lifestyle changes include weight loss, eating a healthy, low-sodium diet, exercising more, and limiting alcohol. This information is not intended to replace advice given to you by your health care provider. Make sure you discuss any questions you have with your health care provider. Document Released: 04/30/2012 Document Revised: 07/04/2016 Document Reviewed: 07/04/2016 Elsevier Interactive Patient Education  [employer].

## 2018-02-18 LAB — COMPLETE METABOLIC PANEL WITH GFR
AG Ratio: 1.8 (calc) (ref 1.0–2.5)
ALT: 14 U/L (ref 6–29)
AST: 17 U/L (ref 10–30)
Albumin: 4.5 g/dL (ref 3.6–5.1)
Alkaline phosphatase (APISO): 75 U/L (ref 33–115)
BUN: 11 mg/dL (ref 7–25)
CO2: 25 mmol/L (ref 20–32)
Calcium: 8.9 mg/dL (ref 8.6–10.2)
Chloride: 106 mmol/L (ref 98–110)
Creat: 0.72 mg/dL (ref 0.50–1.10)
GFR, Est African American: 126 mL/min/{1.73_m2} (ref >=60)
GFR, Est Non African American: 108 mL/min/{1.73_m2} (ref >=60)
Globulin: 2.5 g/dL (calc) (ref 1.9–3.7)
Glucose, Bld: 92 mg/dL (ref 65–99)
Potassium: 4 mmol/L (ref 3.5–5.3)
Sodium: 138 mmol/L (ref 135–146)
Total Bilirubin: 0.7 mg/dL (ref 0.2–1.2)
Total Protein: 7 g/dL (ref 6.1–8.1)

## 2018-02-18 LAB — TSH+FREE T4: TSH W/REFLEX TO FT4: 2.53 mIU/L

## 2018-02-19 ENCOUNTER — Other Ambulatory Visit: Payer: Self-pay | Admitting: Nurse Practitioner

## 2018-02-19 DIAGNOSIS — E063 Autoimmune thyroiditis: Secondary | ICD-10-CM

## 2018-02-19 MED ORDER — LEVOTHYROXINE SODIUM 137 MCG PO TABS: 137.0000 ug | ORAL_TABLET | ORAL | 1 refills | Status: DC

## 2018-02-19 MED ORDER — LEVOTHYROXINE SODIUM 150 MCG PO TABS: 150.0000 ug | ORAL_TABLET | ORAL | 1 refills | Status: DC

## 2018-03-03 ENCOUNTER — Ambulatory Visit (INDEPENDENT_AMBULATORY_CARE_PROVIDER_SITE_OTHER): Payer: Self-pay

## 2018-03-03 VITALS — BP 134/74 | Resp 16 | Ht 64.0 in | Wt 187.2 lb

## 2018-03-03 DIAGNOSIS — I10 Essential (primary) hypertension: Secondary | ICD-10-CM

## 2018-03-03 NOTE — Progress Notes (Signed)
BP much improved.  Continue with regularly scheduled followup in 3 months.

## 2018-05-19 ENCOUNTER — Encounter: Payer: Self-pay | Admitting: Nurse Practitioner

## 2018-05-19 NOTE — Assessment & Plan Note (Signed)
No recent check of hypothyroidism.  Stable at last labs.  Patient taking levothyroxine correctly.  Repeat labs today before refills provided.  Followup 3 months.

## 2018-05-19 NOTE — Assessment & Plan Note (Signed)
Uncontrolled, but is off medication.  Previously took lisinopril and tolerated well.  BP goal < 130/80.  Pt is not currently working on lifestyle modifications.  Taking medications tolerating well without side effects.   Plan: 1. Continue taking lisinopril 10 mg once daily 2. Obtain labs CMP, TSH today  3. Encouraged heart healthy diet and increasing exercise to 30 minutes most days of the week. 4. Check BP 1-2 x per week at home, keep log, and bring to clinic at next appointment. 5. Follow up 3 months.

## 2018-05-20 ENCOUNTER — Ambulatory Visit: Payer: Self-pay | Admitting: Nurse Practitioner

## 2018-05-22 ENCOUNTER — Ambulatory Visit: Payer: Self-pay | Admitting: Nurse Practitioner

## 2018-05-26 ENCOUNTER — Ambulatory Visit: Payer: Self-pay | Admitting: Nurse Practitioner

## 2018-07-07 ENCOUNTER — Ambulatory Visit: Payer: Self-pay | Admitting: Nurse Practitioner

## 2018-07-07 ENCOUNTER — Encounter: Payer: Self-pay | Admitting: Nurse Practitioner

## 2018-07-07 ENCOUNTER — Other Ambulatory Visit: Payer: Self-pay

## 2018-07-07 VITALS — BP 121/74 | HR 75 | Temp 98.6°F | Ht 64.0 in | Wt 188.4 lb

## 2018-07-07 DIAGNOSIS — I10 Essential (primary) hypertension: Secondary | ICD-10-CM

## 2018-07-07 DIAGNOSIS — E063 Autoimmune thyroiditis: Secondary | ICD-10-CM

## 2018-07-07 DIAGNOSIS — J0140 Acute pansinusitis, unspecified: Secondary | ICD-10-CM

## 2018-07-07 MED ORDER — LISINOPRIL 10 MG PO TABS: 10.0000 mg | ORAL_TABLET | Freq: Every day | ORAL | 1 refills | Status: DC

## 2018-07-07 MED ORDER — AMOXICILLIN-POT CLAVULANATE 875-125 MG PO TABS: 1.0000 | ORAL_TABLET | Freq: Two times a day (BID) | ORAL | 0 refills | Status: DC

## 2018-07-07 MED ORDER — HYDRALAZINE HCL 10 MG PO TABS: 10.0000 mg | ORAL_TABLET | Freq: Three times a day (TID) | ORAL | 1 refills | Status: DC | PRN

## 2018-07-07 NOTE — Patient Instructions (Addendum)
Jacqueline Walsh,   Thank you for coming in to clinic today.  1. Hydralazine 10 mg - take one tablet eery 8 hours as needed for blood pressure over 140/90 - CONTINUE lisinopril 10 mg once daily.  2. Thyroid meds - refills after labs.  3. It sounds like you have an Bacterial sinus infection after your cold.  Recommend good hand washing. - START taking Augmentin 875-125 mg one tablet twice daily for 10 days.  Make sure to take all doses of your antibiotic. - While you are on an antibiotic, take a probiotic.  Antibiotics kill good and bad bacteria.  A probiotic helps to replace your good bacteria. Probiotic pills can be found over the counter.  One brand is Florastor, but you can use any brand you prefer.  You can also get good bacteria from foods.  These foods are yogurt, kefir, kombucha, and fresh, refrigerated and uncooked sauerkraut. - Drink plenty of fluids.  - Start anti-histamine loratadine or cetirizine 10mg  daily. - You may also use Flonase 2 sprays each nostril daily for up to 4-6 weeks - If congestion is worse, start OTC Mucinex (or may try Mucinex-DM for cough) up to 7-10 days then stop  If needed: - You may try over the counter Nasal Saline spray (Simply Saline, Ocean Spray) as needed to reduce congestion. - Start taking Tylenol extra strength 1 to 2 tablets every 6-8 hours for aches or fever/chills for next few days as needed.  Do not take more than 3,000 mg in 24 hours from all medicines.   - Drink warm herbal tea with honey for sore throat.   If symptoms are significantly worse with persistent fevers/chills despite tylenol/ibpurofen, nausea, vomiting unable to tolerate food/fluids or medicine, body aches, or shortness of breath, sinus pain pressure or worsening productive cough, then follow-up for re-evaluation, may seek more immediate care at Urgent Care or the ED if you are more concerned that it is an emergency.  Please schedule a follow-up appointment with Jacqueline Walsh, AGNP.  Return in about 6 months (around 01/05/2019) for hypertension and thyroid.  If you have any other questions or concerns, please feel free to call the clinic or send a message through MyChart. You may also schedule an earlier appointment if necessary.  You will receive a survey after today's visit either digitally by e-mail or paper by [employer] mail. Your experiences and feedback matter to us.  Please respond so we know how we are doing as we provide care for you.   Jacqueline McardleLauren Calyb Mcquarrie, DNP, AGNP-BC Adult Gerontology Nurse Practitioner [facility], [id number]

## 2018-07-07 NOTE — Progress Notes (Signed)
Subjective:    Patient ID: Jacqueline Walsh, female    DOB: 28-Dec-1980, 37 y.o.   MRN: [id number]  Jacqueline Walsh is a 37 y.o. female presenting on 07/07/2018 for Hypertension (need refill on thyroid and blood pressure medication) and Sinus Problem (frontal lobe pressure w/ nasal drainage x 4 days )   HPI Hypothyroidism - Pt states she is taking her levothyroxine on Sat, Sun; 150 mcg M-F in the am at least 1 hour before eating or drinking and taking other medicines.   - She is not currently symptomatic. - She denies, fatigue, excess energy, weight changes, heart racing, heart palpitations, heat and cold intolerance, changes in hair/skin/nails, and lower leg swelling.  - She does not have any compressive symptoms to include difficulty swallowing, globus sensation, or difficulty breathing when lying flat.   Hypertension - She is checking BP at home or outside of clinic.  Readings only when she feels it is high with highest SBP 165.  No known triggers for these higher readings. - Current medications: lisinopril 10 mg once daily, tolerating well without side effects - She is not currently symptomatic.  Feels headache when she has high BP and chest heaviness.  Not nearly as severe as in past. - Pt denies headache, lightheadedness, dizziness, changes in vision, chest tightness/pressure, palpitations, leg swelling, sudden loss of speech or loss of consciousness. - She  reports no regular exercise routine. - Her diet is moderate in salt, moderate in fat, and moderate in carbohydrates.   Sinus pressure Increased for 4 days.  Has had mild fever intermittently. - Is taking OTC tylenol cold and sinus.  - Had a cold started about 2 Saturdays ago.    Social History   Tobacco Use  . Smoking status: Never Smoker  . Smokeless tobacco: Never Used  Substance Use Topics  . Alcohol use: No    Comment: occas  . Drug use: No    Review of Systems Per HPI unless specifically indicated above       Objective:    BP 121/74 (BP Location: Right Arm, Patient Position: Sitting, Cuff Size: Normal)   Pulse 75   Temp 98.6 F (37 C) (Oral)   Ht 5\' 4"  (1.626 m)   Wt 188 lb 6.4 oz (85.5 kg)   BMI 32.34 kg/m   Wt Readings from Last 3 Encounters:  07/07/18 188 lb 6.4 oz (85.5 kg)  03/03/18 187 lb 3.2 oz (84.9 kg)  02/17/18 187 lb 3.2 oz (84.9 kg)    Physical Exam  Constitutional: She is oriented to person, place, and time. She appears well-developed and well-nourished. No distress.  HENT:  Head: Normocephalic and atraumatic.  Right Ear: Hearing, tympanic membrane, external ear and ear canal normal.  Left Ear: Hearing, tympanic membrane, external ear and ear canal normal.  Nose: Mucosal edema and rhinorrhea present. Right sinus exhibits maxillary sinus tenderness and frontal sinus tenderness. Left sinus exhibits maxillary sinus tenderness and frontal sinus tenderness.  Mouth/Throat: Uvula is midline, oropharynx is clear and moist and mucous membranes are normal. Tonsils are 0 on the right. Tonsils are 0 on the left.  Eyes: Pupils are equal, round, and reactive to light. Conjunctivae, EOM and lids are normal.  Neck: Normal range of motion. Neck supple. Carotid bruit is not present.  Cardiovascular: Normal rate, regular rhythm, S1 normal, S2 normal, normal heart sounds and intact distal pulses.  Pulmonary/Chest: Effort normal and breath sounds normal. No respiratory distress.  Musculoskeletal: She exhibits no  edema (pedal).  Neurological: She is alert and oriented to person, place, and time.  Skin: Skin is warm and dry.  Psychiatric: She has a normal mood and affect. Her behavior is normal.  Vitals reviewed.   Results for orders placed or performed in visit on 02/17/18  COMPLETE METABOLIC PANEL WITH GFR  Result Value Ref Range   Glucose, Bld 92 65 - 99 mg/dL   BUN 11 7 - 25 mg/dL   Creat 8.290.72 5.620.50 - 1.301.10 mg/dL   GFR, Est Non African American 108 > OR = 60 mL/min/1.6773m2   GFR, Est  African American 126 > OR = 60 mL/min/1.2973m2   BUN/Creatinine Ratio NOT APPLICABLE 6 - 22 (calc)   Sodium 138 135 - 146 mmol/L   Potassium 4.0 3.5 - 5.3 mmol/L   Chloride 106 98 - 110 mmol/L   CO2 25 20 - 32 mmol/L   Calcium 8.9 8.6 - 10.2 mg/dL   Total Protein 7.0 6.1 - 8.1 g/dL   Albumin 4.5 3.6 - 5.1 g/dL   Globulin 2.5 1.9 - 3.7 g/dL (calc)   AG Ratio 1.8 1.0 - 2.5 (calc)   Total Bilirubin 0.7 0.2 - 1.2 mg/dL   Alkaline phosphatase (APISO) 75 33 - 115 U/L   AST 17 10 - 30 U/L   ALT 14 6 - 29 U/L  TSH + free T4  Result Value Ref Range   TSH W/REFLEX TO FT4 2.53 mIU/L      Assessment & Plan:   Problem List Items Addressed This Visit      Cardiovascular and Mediastinum   Essential hypertension Controlled hypertension with episodic increases in BP without known cause.  BP goal < 130/80.  Pt is working on lifestyle modifications.  Taking medications tolerating well without side effects.   Plan: 1. Continue taking lisinopril 10 mg once daily - START hydralazine 10 mg one tab po tid prn if BP > 140/90 2. Obtain labs  3. Encouraged heart healthy diet and increasing exercise to 30 minutes most days of the week. 4. Check BP 1-2 x per week at home, keep log, and bring to clinic at next appointment. 5. Follow up 3 months.     Relevant Medications   lisinopril (PRINIVIL,ZESTRIL) 10 MG tablet   hydrALAZINE (APRESOLINE) 10 MG tablet   Other Relevant Orders   BASIC METABOLIC PANEL WITH GFR (Completed)     Endocrine   Hashimoto's disease Stable on last labs.  Needs lab recheck for refills.  Patient taking alternating dose of 137 and 150 mcg.   Plan: 1. Continue current dose for now.   2. Labs today 3. Follow-up 6 months and if needed in 6 weeks for repeat labs.   Relevant Orders   TSH (Completed)    Other Visit Diagnoses    Acute non-recurrent pansinusitis    -  Primary Consistent with URI and secondary sinusitis with symptoms worsening over the past 7 days and initial  symptoms of nasal congestion and sinus pressure over 2 weeks ago.   Plan: 1.START taking augmentin 875-125 mg tablets every 12 hours for 10 days.  Discussed completing antibiotic. - While on antibiotic, take a probiotic OTC or from food. - Start Atrovent nasal spray decongestant 2 sprays each nostril up to 4 times daily for 5-7 days - Continue anti-histamine loratadine 10mg  daily. - Can use Flonase 2 sprays each nostril daily for up to 4-6 weeks if no epistaxis. - Start Mucinex-DM OTC for  7-10 days prn congestion 2.  Supportive care with nasal saline, warm herbal tea with honey, 3. Improve hydration 4. Tylenol / Motrin PRN fevers  5. Return criteria given    Relevant Medications   amoxicillin-clavulanate (AUGMENTIN) 875-125 MG tablet      Meds ordered this encounter  Medications  . lisinopril (PRINIVIL,ZESTRIL) 10 MG tablet    Sig: Take 1 tablet (10 mg total) by mouth daily.    Dispense:  90 tablet    Refill:  1  . hydrALAZINE (APRESOLINE) 10 MG tablet    Sig: Take 1 tablet (10 mg total) by mouth every 8 (eight) hours as needed (BP > 140/90).    Dispense:  30 tablet    Refill:  1    Order Specific Question:   Supervising Provider    Answer:   Smitty Cords [[id number]]    Follow up plan: Return in about 6 months (around 01/05/2019) for hypertension and thyroid.  Wilhelmina Mcardle, DNP, AGPCNP-BC Adult Gerontology Primary Care Nurse Practitioner [facility] 07/07/2018, 4:41 PM

## 2018-07-08 LAB — BASIC METABOLIC PANEL WITH GFR
BUN: 11 mg/dL (ref 7–25)
CO2: 23 mmol/L (ref 20–32)
Calcium: 8.7 mg/dL (ref 8.6–10.2)
Chloride: 106 mmol/L (ref 98–110)
Creat: 0.81 mg/dL (ref 0.50–1.10)
GFR, Est African American: 109 mL/min/{1.73_m2} (ref >=60)
GFR, Est Non African American: 94 mL/min/{1.73_m2} (ref >=60)
Glucose, Bld: 89 mg/dL (ref 65–99)
Potassium: 4 mmol/L (ref 3.5–5.3)
Sodium: 139 mmol/L (ref 135–146)

## 2018-07-08 LAB — TSH: TSH: 12.76 mIU/L — ABNORMAL HIGH

## 2018-07-09 ENCOUNTER — Other Ambulatory Visit: Payer: Self-pay | Admitting: Nurse Practitioner

## 2018-07-09 DIAGNOSIS — E063 Autoimmune thyroiditis: Secondary | ICD-10-CM

## 2018-07-09 MED ORDER — LEVOTHYROXINE SODIUM 150 MCG PO TABS: 150.0000 ug | ORAL_TABLET | Freq: Every day | ORAL | 1 refills | Status: DC

## 2018-07-10 ENCOUNTER — Encounter: Payer: Self-pay | Admitting: Nurse Practitioner

## 2018-07-10 DIAGNOSIS — J0140 Acute pansinusitis, unspecified: Secondary | ICD-10-CM

## 2018-07-10 MED ORDER — DOXYCYCLINE HYCLATE 100 MG PO TABS: 100.0000 mg | ORAL_TABLET | Freq: Two times a day (BID) | ORAL | 0 refills | Status: DC

## 2018-07-10 NOTE — Telephone Encounter (Signed)
.  mychart

## 2018-07-10 NOTE — Telephone Encounter (Signed)
Mychart message

## 2018-08-07 ENCOUNTER — Encounter: Payer: Self-pay | Admitting: Nurse Practitioner

## 2019-07-24 ENCOUNTER — Other Ambulatory Visit: Payer: Self-pay

## 2019-07-24 DIAGNOSIS — Z20822 Contact with and (suspected) exposure to covid-19: Secondary | ICD-10-CM

## 2019-07-24 NOTE — Progress Notes (Unsigned)
n

## 2019-07-26 LAB — NOVEL CORONAVIRUS, NAA: SARS-CoV-2, NAA: NOT DETECTED

## 2019-08-18 ENCOUNTER — Telehealth: Payer: Self-pay | Admitting: Nurse Practitioner

## 2019-08-18 DIAGNOSIS — E063 Autoimmune thyroiditis: Secondary | ICD-10-CM

## 2019-08-18 MED ORDER — LEVOTHYROXINE SODIUM 150 MCG PO TABS: 150.0000 ug | ORAL_TABLET | Freq: Every day | ORAL | 0 refills | Status: DC

## 2019-08-18 NOTE — Telephone Encounter (Signed)
Pt . Called requesting refill on levothyroxine 150 MCG. Pt also wanted to know if you would be her new  PCP. Pt also is requesting a call back from the office to let her know. Pt call back # is [phone]

## 2019-08-18 NOTE — Telephone Encounter (Signed)
Left detail message as per Dr.K

## 2019-08-18 NOTE — Telephone Encounter (Signed)
Refilled Levothyroxine.  PCP change is up to her. Please let her know that I recommend Lauren's patients first consider establishing with her replacement Elmyra Ricks to transfer her care.  But if she has particular reason she is interested to switch to me that would be ok.  Nobie Putnam, DO [facility] 08/18/2019, 12:13 PM

## 2019-08-18 NOTE — Telephone Encounter (Signed)
Left message for patient to call back  

## 2019-09-29 ENCOUNTER — Other Ambulatory Visit: Payer: Self-pay | Admitting: Family Medicine

## 2019-09-29 DIAGNOSIS — E063 Autoimmune thyroiditis: Secondary | ICD-10-CM

## 2019-09-30 ENCOUNTER — Other Ambulatory Visit: Payer: Self-pay

## 2019-09-30 ENCOUNTER — Telehealth: Payer: Self-pay

## 2019-09-30 DIAGNOSIS — E063 Autoimmune thyroiditis: Secondary | ICD-10-CM

## 2019-09-30 NOTE — Telephone Encounter (Signed)
Open in error

## 2019-10-01 LAB — T4, FREE: Free T4: 1.4 ng/dL (ref 0.8–1.8)

## 2019-10-01 LAB — T3, FREE: T3, Free: 3.3 pg/mL (ref 2.3–4.2)

## 2019-10-01 LAB — TSH: TSH: 1.91 mIU/L

## 2019-10-16 ENCOUNTER — Telehealth: Payer: Self-pay

## 2019-10-16 NOTE — Telephone Encounter (Signed)
Pt called traige and said she needs to schedule a pap. Her last pap/last visit was in 2016. Says since Oct 2020 her periods are lasting about two weeks and they are heavy. Denies any abnormal pain or changing pad more than two times in less than an hours. Advised to go ahead and schedule annual, unless she wants to come in and schedule a "problem visit" but I advised a pap at that visit would be up to provider (since she sounds like she wants a pap). Transferred to Raynelle Fanning to schedule appt.

## 2019-11-10 NOTE — Progress Notes (Signed)
PCP:  Mikey College, NP (Inactive)   Chief Complaint  Patient presents with  . Gynecologic Exam  . Menorrhagia    big clots for the past 6 months, no abnormal pain  . Vaginal Discharge    usually after cycle ends x 6 months     HPI:      [name] is a 39 y.o. No obstetric history on file. who LMP was Patient's last menstrual period was 10/12/2019 (approximate)., presents today for her NP> 3 yrs annual examination.  Her menses are regular every 28-30 days, lasting 8-10 days; 6-8 days of heavy flow, with 1/2 dollar sized clots. Flow heavier for past 6 months.  No BTB, no dysmen. Does have migraines, improved with excedrin.  Hx of Hashimotos but was euthyroid on 2/21 labs.   Pt also with complaints of facial hair for many yrs. Reviewed chart and pt was seen by endocrine in 2014 with dx of PCOS due to elevated testosterone levels. Pt had a few months of infrequent periods due to wt gain, but menses returned to monthly. Hirsutism has persisted. Pt tried OCPs but had elevated BP so had to stop. Has not done any other tx, including spironolactone. No hx of HTN, DVTs, migraines with aura.  Sex activity: single partner, contraception - tubal ligation.  Last Pap: 10/17/12  Results were: no abnormalities /neg HPV DNA. No hx of abn paps.   There is no FH of breast cancer. There is no FH of ovarian cancer. The patient does do self-breast exams.  Tobacco use: The patient denies current or previous tobacco use. Alcohol use: none No drug use.  Exercise: moderately active  She does get adequate calcium but not Vitamin D in her diet.  PT IS SELF PAY  Past Medical History:  Diagnosis Date  . Hypertension   . Hypothyroidism   . Migraine, unspecified, without mention of intractable migraine without mention of status migrainosus   . Thyroid disease     Past Surgical History:  Procedure Laterality Date  . LAPAROSCOPIC APPENDECTOMY N/A 01/07/2018   Procedure: APPENDECTOMY  LAPAROSCOPIC;  Surgeon: Jules Husbands, MD;  Location: [facility] ORS;  Service: General;  Laterality: N/A;  . TUBAL LIGATION      Family History  Problem Relation Age of Onset  . Hypertension Mother   . Hyperlipidemia Mother   . Transient ischemic attack Mother   . Thyroid disease Father   . Hypertension Father   . Thyroid disease Sister     Social History   Socioeconomic History  . Marital status: Married    Spouse name: Not on file  . Number of children: Not on file  . Years of education: Not on file  . Highest education level: Not on file  Occupational History  . Not on file  Tobacco Use  . Smoking status: Never Smoker  . Smokeless tobacco: Never Used  Substance and Sexual Activity  . Alcohol use: No    Comment: occas  . Drug use: No  . Sexual activity: Yes    Birth control/protection: None, Surgical    Comment: Tubal ligation  Other Topics Concern  . Not on file  Social History Narrative  . Not on file   Social Determinants of Health   Financial Resource Strain:   . Difficulty of Paying Living Expenses:   Food Insecurity:   . Worried About Charity fundraiser in the Last Year:   . [place] in the Last  Year:   Transportation Needs:   . Freight forwarder (Medical):   Marland Kitchen Lack of Transportation (Non-Medical):   Physical Activity:   . Days of Exercise per Week:   . Minutes of Exercise per Session:   Stress:   . Feeling of Stress :   Social Connections:   . Frequency of Communication with Friends and Family:   . Frequency of Social Gatherings with Friends and Family:   . Attends Religious Services:   . Active Member of Clubs or Organizations:   . Attends Banker Meetings:   Marland Kitchen Marital Status:   Intimate Partner Violence:   . Fear of Current or Ex-Partner:   . Emotionally Abused:   Marland Kitchen Physically Abused:   . Sexually Abused:      Current Outpatient Medications:  .  levothyroxine (SYNTHROID) 150 MCG tablet, Take 1 tablet (150 mcg  total) by mouth daily before breakfast., Disp: 90 tablet, Rfl: 0 .  hydrALAZINE (APRESOLINE) 10 MG tablet, Take 1 tablet (10 mg total) by mouth every 8 (eight) hours as needed (BP > 140/90). (Patient not taking: Reported on 11/11/2019), Disp: 30 tablet, Rfl: 1     ROS:  Review of Systems  Constitutional: Negative for fatigue, fever and unexpected weight change.  Respiratory: Negative for cough, shortness of breath and wheezing.   Cardiovascular: Negative for chest pain, palpitations and leg swelling.  Gastrointestinal: Negative for blood in stool, constipation, diarrhea, nausea and vomiting.  Endocrine: Negative for cold intolerance, heat intolerance and polyuria.  Genitourinary: Negative for dyspareunia, dysuria, flank pain, frequency, genital sores, hematuria, menstrual problem, pelvic pain, urgency, vaginal bleeding, vaginal discharge and vaginal pain.  Musculoskeletal: Negative for back pain, joint swelling and myalgias.  Skin: Negative for rash.  Neurological: Negative for dizziness, syncope, light-headedness, numbness and headaches.  Hematological: Negative for adenopathy.  Psychiatric/Behavioral: Negative for agitation, confusion, sleep disturbance and suicidal ideas. The patient is not nervous/anxious.   BREAST: No symptoms   Objective: BP 100/70   Ht 5\' 4"  (1.626 m)   Wt 185 lb (83.9 kg)   LMP 10/12/2019 (Approximate)   BMI 31.76 kg/m    Physical Exam Constitutional:      Appearance: She is well-developed.  Genitourinary:     Vulva, vagina, cervix, uterus, right adnexa and left adnexa normal.     No vulval lesion or tenderness noted.     No vaginal discharge, erythema or tenderness.     No cervical polyp.     Uterus is not enlarged or tender.     No right or left adnexal mass present.     Right adnexa not tender.     Left adnexa not tender.  Neck:     Thyroid: No thyromegaly.  Cardiovascular:     Rate and Rhythm: Normal rate and regular rhythm.     Heart  sounds: Normal heart sounds. No murmur.  Pulmonary:     Effort: Pulmonary effort is normal.     Breath sounds: Normal breath sounds.  Chest:     Breasts:        Right: No mass, nipple discharge, skin change or tenderness.        Left: No mass, nipple discharge, skin change or tenderness.  Abdominal:     Palpations: Abdomen is soft.     Tenderness: There is no abdominal tenderness. There is no guarding.  Musculoskeletal:        General: Normal range of motion.     Cervical back: Normal range of  motion.  Neurological:     General: No focal deficit present.     Mental Status: She is alert and oriented to person, place, and time.     Cranial Nerves: No cranial nerve deficit.  Skin:    General: Skin is warm and dry.  Psychiatric:        Mood and Affect: Mood normal.        Behavior: Behavior normal.        Thought Content: Thought content normal.        Judgment: Judgment normal.  Vitals reviewed.    Results for orders placed or performed in visit on 11/11/19 (from the past 24 hour(s))  POCT hemoglobin     Status: Normal   Collection Time: 11/11/19  9:00 AM  Result Value Ref Range   Hemoglobin 11.5 11 - 14.6 g/dL    Assessment/Plan: Encounter for annual routine gynecological examination  Cervical cancer screening - Plan: Other/Misc lab test  Screening for HPV (human papillomavirus) - Plan: Other/Misc lab test  Menorrhagia with regular cycle - Plan: POCT hemoglobin; WNL HgB. Discussed mgmt options including OCPs, depo, nexplanon, IUD. Pt is self pay. Had elevated BP in past with OCPs but could try low dose pill and follow BP. Would improve menses and hirsutism. Also discussed GYN u/s prn.  Hirsutism - Plan: Testosterone,Free and Total; check labs. May need to eval further based on levels. Will f/u with results.             GYN counsel family planning choices, adequate intake of calcium and vitamin D, diet and exercise     F/U  Return in about 1 year (around  11/10/2020).  Taylour Lietzke B. Jacobo Moncrief, PA-C 11/11/2019 9:34 AM

## 2019-11-11 ENCOUNTER — Encounter: Payer: Self-pay | Admitting: Obstetrics and Gynecology

## 2019-11-11 ENCOUNTER — Other Ambulatory Visit: Payer: Self-pay

## 2019-11-11 ENCOUNTER — Ambulatory Visit (INDEPENDENT_AMBULATORY_CARE_PROVIDER_SITE_OTHER): Payer: Self-pay | Admitting: Obstetrics and Gynecology

## 2019-11-11 VITALS — BP 100/70 | Ht 64.0 in | Wt 185.0 lb

## 2019-11-11 DIAGNOSIS — L680 Hirsutism: Secondary | ICD-10-CM

## 2019-11-11 DIAGNOSIS — Z01419 Encounter for gynecological examination (general) (routine) without abnormal findings: Secondary | ICD-10-CM

## 2019-11-11 DIAGNOSIS — Z124 Encounter for screening for malignant neoplasm of cervix: Secondary | ICD-10-CM

## 2019-11-11 DIAGNOSIS — Z1151 Encounter for screening for human papillomavirus (HPV): Secondary | ICD-10-CM

## 2019-11-11 DIAGNOSIS — N920 Excessive and frequent menstruation with regular cycle: Secondary | ICD-10-CM

## 2019-11-11 LAB — POCT HEMOGLOBIN: Hemoglobin: 11.5 g/dL (ref 11–14.6)

## 2019-11-11 NOTE — Patient Instructions (Signed)
I value your feedback and entrusting us with your care. If you get a [facility] patient survey, I would appreciate you taking the time to let us know about your experience today. Thank you!  As of July 30, 2019, your lab results will be released to your MyChart immediately, before I even have a chance to see them. Please give me time to review them and contact you if there are any abnormalities. Thank you for your patience.  

## 2019-11-15 LAB — TESTOSTERONE,FREE AND TOTAL
Testosterone, Free: 2.5 pg/mL (ref 0.0–4.2)
Testosterone: 25 ng/dL (ref 8–48)

## 2019-11-16 ENCOUNTER — Telehealth: Payer: Self-pay | Admitting: Obstetrics and Gynecology

## 2019-11-16 MED ORDER — NORGESTIMATE-ETH ESTRADIOL 0.25-35 MG-MCG PO TABS: 1.0000 | ORAL_TABLET | Freq: Every day | ORAL | 0 refills | Status: DC

## 2019-11-16 NOTE — Telephone Encounter (Signed)
Pt aware of normal testosterone levels for hirsutism. Has menorrhagia. Will try OCPs for sx control of both. Rx sprintec due to self pay. Pt with hx of elevated BP with OCPs in past. Pt to check BP and f/u if 130/90. If not, f/u via phone in 3 months re: sx.

## 2019-11-25 ENCOUNTER — Telehealth: Payer: Self-pay

## 2019-11-25 NOTE — Telephone Encounter (Signed)
Pt calling for pap results, shes aware they are not back yet

## 2019-11-26 ENCOUNTER — Emergency Department
Admission: EM | Admit: 2019-11-26 | Discharge: 2019-11-26 | Disposition: A | Discharge status: Home / Self Care | Payer: Self-pay | Attending: Emergency Medicine | Admitting: Emergency Medicine

## 2019-11-26 ENCOUNTER — Emergency Department: Payer: Self-pay

## 2019-11-26 ENCOUNTER — Encounter: Payer: Self-pay | Admitting: Emergency Medicine

## 2019-11-26 ENCOUNTER — Other Ambulatory Visit: Payer: Self-pay

## 2019-11-26 DIAGNOSIS — R0789 Other chest pain: Secondary | ICD-10-CM | POA: Insufficient documentation

## 2019-11-26 DIAGNOSIS — E063 Autoimmune thyroiditis: Secondary | ICD-10-CM | POA: Insufficient documentation

## 2019-11-26 DIAGNOSIS — E039 Hypothyroidism, unspecified: Secondary | ICD-10-CM | POA: Insufficient documentation

## 2019-11-26 DIAGNOSIS — Z79899 Other long term (current) drug therapy: Secondary | ICD-10-CM | POA: Insufficient documentation

## 2019-11-26 DIAGNOSIS — I10 Essential (primary) hypertension: Secondary | ICD-10-CM | POA: Insufficient documentation

## 2019-11-26 DIAGNOSIS — R1013 Epigastric pain: Secondary | ICD-10-CM | POA: Insufficient documentation

## 2019-11-26 DIAGNOSIS — R0602 Shortness of breath: Secondary | ICD-10-CM | POA: Insufficient documentation

## 2019-11-26 DIAGNOSIS — R5383 Other fatigue: Secondary | ICD-10-CM | POA: Insufficient documentation

## 2019-11-26 LAB — CBC
HCT: 39 % (ref 36.0–46.0)
Hemoglobin: 12.6 g/dL (ref 12.0–15.0)
MCH: 26.6 pg (ref 26.0–34.0)
MCHC: 32.3 g/dL (ref 30.0–36.0)
MCV: 82.5 fL (ref 80.0–100.0)
Platelets: 318 10*3/uL (ref 150–400)
RBC: 4.73 MIL/uL (ref 3.87–5.11)
RDW: 13.1 % (ref 11.5–15.5)
WBC: 7.3 10*3/uL (ref 4.0–10.5)
nRBC: 0 % (ref 0.0–0.2)

## 2019-11-26 LAB — BASIC METABOLIC PANEL
Anion gap: 9 (ref 5–15)
BUN: 9 mg/dL (ref 6–20)
CO2: 24 mmol/L (ref 22–32)
Calcium: 8.2 mg/dL — ABNORMAL LOW (ref 8.9–10.3)
Chloride: 103 mmol/L (ref 98–111)
Creatinine, Ser: 0.61 mg/dL (ref 0.44–1.00)
GFR calc Af Amer: >60 mL/min (ref >60)
GFR calc non Af Amer: >60 mL/min (ref >60)
Glucose, Bld: 106 mg/dL — ABNORMAL HIGH (ref 70–99)
Potassium: 3.5 mmol/L (ref 3.5–5.1)
Sodium: 136 mmol/L (ref 135–145)

## 2019-11-26 LAB — TROPONIN I (HIGH SENSITIVITY)
Troponin I (High Sensitivity): 8 ng/L (ref <18)
Troponin I (High Sensitivity): 8 ng/L (ref <18)

## 2019-11-26 LAB — LIPASE, BLOOD: Lipase: 25 U/L (ref 11–51)

## 2019-11-26 LAB — HEPATIC FUNCTION PANEL
ALT: 18 U/L (ref 0–44)
AST: 25 U/L (ref 15–41)
Albumin: 4.2 g/dL (ref 3.5–5.0)
Alkaline Phosphatase: 76 U/L (ref 38–126)
Bilirubin, Direct: <0.1 mg/dL (ref 0.0–0.2)
Total Bilirubin: 0.6 mg/dL (ref 0.3–1.2)
Total Protein: 8.1 g/dL (ref 6.5–8.1)

## 2019-11-26 LAB — POCT PREGNANCY, URINE: Preg Test, Ur: NEGATIVE

## 2019-11-26 MED ORDER — DIPHENHYDRAMINE HCL 50 MG/ML IJ SOLN
25.0000 mg | Freq: Once | INTRAMUSCULAR | Status: AC
  Administered 2019-11-26: 25 mg via INTRAVENOUS
  Filled 2019-11-26: qty 1

## 2019-11-26 MED ORDER — IOHEXOL 350 MG/ML SOLN
100.0000 mL | Freq: Once | INTRAVENOUS | Status: AC | PRN
  Administered 2019-11-26: 100 mL via INTRAVENOUS

## 2019-11-26 MED ORDER — DIPHENHYDRAMINE HCL 25 MG PO CAPS: 50.0000 mg | ORAL_CAPSULE | Freq: Once | ORAL | Status: DC

## 2019-11-26 MED ORDER — DIPHENHYDRAMINE HCL 50 MG/ML IJ SOLN: 25.0000 mg | Freq: Once | INTRAMUSCULAR | Status: DC

## 2019-11-26 MED ORDER — SODIUM CHLORIDE 0.9 % IV BOLUS
1000.0000 mL | Freq: Once | INTRAVENOUS | Status: AC
  Administered 2019-11-26: 1000 mL via INTRAVENOUS

## 2019-11-26 MED ORDER — DIPHENHYDRAMINE HCL 50 MG/ML IJ SOLN: 50.0000 mg | Freq: Once | INTRAMUSCULAR | Status: DC

## 2019-11-26 MED ORDER — ACETAMINOPHEN 500 MG PO TABS
1000.0000 mg | ORAL_TABLET | Freq: Once | ORAL | Status: AC
  Administered 2019-11-26: 1000 mg via ORAL
  Filled 2019-11-26: qty 2

## 2019-11-26 MED ORDER — LORAZEPAM 1 MG PO TABS: 1.0000 mg | ORAL_TABLET | Freq: Three times a day (TID) | ORAL | 0 refills | Status: DC | PRN

## 2019-11-26 MED ORDER — PROCHLORPERAZINE EDISYLATE 10 MG/2ML IJ SOLN
10.0000 mg | Freq: Once | INTRAMUSCULAR | Status: AC
  Administered 2019-11-26: 10 mg via INTRAVENOUS
  Filled 2019-11-26: qty 2

## 2019-11-26 MED ORDER — HYDROCORTISONE NA SUCCINATE PF 250 MG IJ SOLR
200.0000 mg | Freq: Once | INTRAMUSCULAR | Status: AC
  Administered 2019-11-26: 200 mg via INTRAVENOUS
  Filled 2019-11-26: qty 200

## 2019-11-26 MED ORDER — FENTANYL CITRATE (PF) 100 MCG/2ML IJ SOLN
75.0000 ug | Freq: Once | INTRAMUSCULAR | Status: AC
  Administered 2019-11-26: 75 ug via INTRAVENOUS
  Filled 2019-11-26: qty 2

## 2019-11-26 MED ORDER — ONDANSETRON HCL 4 MG/2ML IJ SOLN
4.0000 mg | Freq: Once | INTRAMUSCULAR | Status: AC
  Administered 2019-11-26: 4 mg via INTRAVENOUS
  Filled 2019-11-26: qty 2

## 2019-11-26 NOTE — ED Notes (Signed)
Pt's husband at bedside.

## 2019-11-26 NOTE — ED Provider Notes (Signed)
CT is unremarkable, she is cleared for outpatient follow-up.   Emily Filbert, MD 11/26/19 [phone]

## 2019-11-26 NOTE — ED Provider Notes (Signed)
[facility] Emergency Department Provider Note  ____________________________________________   First MD Initiated Contact with Patient 11/26/19 1055     (approximate)  I have reviewed the triage vital signs and the nursing notes.   HISTORY  Chief Complaint Chest Pain    HPI Jacqueline Walsh is a 39 y.o. female with history of hypertension, hypothyroidism, here with chest pain.  Patient states that over the last 12 hours or so, she has had progressively worsening sharp, stabbing, tearing, epigastric chest pain.  This radiates towards her back.  She had associated shortness of breath.  She feels like the pain is worse with exertion as well as movement.  No coughing.  No recent fevers or chills.  She has noticed that her blood pressure has been steadily increasing at home, which she has been diagnosed with though she does not take anything for it.  Denies any cocaine or other drug use.  No other acute complaints.  Nursing medication change.  No over-the-counter medication changes.  She does endorse increasing stress at work.        Past Medical History:  Diagnosis Date  . Hypertension   . Hypothyroidism   . Migraine, unspecified, without mention of intractable migraine without mention of status migrainosus   . Thyroid disease     Patient Active Problem List   Diagnosis Date Noted  . Hirsutism 11/11/2019  . Menorrhagia with regular cycle 11/11/2019  . Hashimoto's disease 01/20/2018  . Appendicitis 01/07/2018  . Acute appendicitis   . Chest pressure 01/15/2013  . Essential hypertension 01/15/2013  . Migraines 01/15/2013    Past Surgical History:  Procedure Laterality Date  . LAPAROSCOPIC APPENDECTOMY N/A 01/07/2018   Procedure: APPENDECTOMY LAPAROSCOPIC;  Surgeon: Jules Husbands, MD;  Location: [facility] ORS;  Service: General;  Laterality: N/A;  . TUBAL LIGATION      Prior to Admission medications   Medication Sig Start Date End Date Taking? Authorizing  Provider  hydrALAZINE (APRESOLINE) 10 MG tablet Take 1 tablet (10 mg total) by mouth every 8 (eight) hours as needed (BP > 140/90). Patient not taking: Reported on 11/11/2019 07/07/18   Mikey College, NP  levothyroxine (SYNTHROID) 150 MCG tablet Take 1 tablet (150 mcg total) by mouth daily before breakfast. 08/18/19   Olin Hauser, DO  norgestimate-ethinyl estradiol (ORTHO-CYCLEN) 0.25-35 MG-MCG tablet Take 1 tablet by mouth daily. 9/48/54   Copland, Deirdre Evener, PA-C    Allergies Codeine  Family History  Problem Relation Age of Onset  . Hypertension Mother   . Hyperlipidemia Mother   . Transient ischemic attack Mother   . Thyroid disease Father   . Hypertension Father   . Thyroid disease Sister     Social History Social History   Tobacco Use  . Smoking status: Never Smoker  . Smokeless tobacco: Never Used  Substance Use Topics  . Alcohol use: No    Comment: occas  . Drug use: No    Review of Systems  Review of Systems  Constitutional: Positive for fatigue. Negative for fever.  HENT: Negative for congestion and sore throat.   Eyes: Negative for visual disturbance.  Respiratory: Positive for chest tightness. Negative for cough and shortness of breath.   Cardiovascular: Positive for chest pain.  Gastrointestinal: Negative for abdominal pain, diarrhea, nausea and vomiting.  Genitourinary: Negative for flank pain.  Musculoskeletal: Negative for back pain and neck pain.  Skin: Negative for rash and wound.  Neurological: Negative for weakness.  All other  systems reviewed and are negative.    ____________________________________________  PHYSICAL EXAM:      VITAL SIGNS: ED Triage Vitals  Enc Vitals Group     BP 11/26/19 0902 (!) 191/107     Pulse Rate 11/26/19 0902 98     Resp 11/26/19 0902 18     Temp 11/26/19 0902 98.2 F (36.8 C)     Temp Source 11/26/19 0902 Oral     SpO2 11/26/19 0902 100 %     Weight 11/26/19 0903 185 lb (83.9 kg)      Height 11/26/19 0903 5\' 4"  (1.626 m)     Head Circumference --      Peak Flow --      Pain Score 11/26/19 0902 8     Pain Loc --      Pain Edu? --      Excl. in GC? --      Physical Exam Vitals and nursing note reviewed.  Constitutional:      General: She is not in acute distress.    Appearance: She is well-developed.     Comments: Appears uncomfortable  HENT:     Head: Normocephalic and atraumatic.  Eyes:     Conjunctiva/sclera: Conjunctivae normal.  Cardiovascular:     Rate and Rhythm: Normal rate and regular rhythm.     Heart sounds: Normal heart sounds. No murmur. No friction rub.  Pulmonary:     Effort: Pulmonary effort is normal. No respiratory distress.     Breath sounds: Normal breath sounds. No wheezing or rales.  Abdominal:     General: There is no distension.     Palpations: Abdomen is soft.     Tenderness: There is no abdominal tenderness.  Musculoskeletal:     Cervical back: Neck supple.  Skin:    General: Skin is warm.     Capillary Refill: Capillary refill takes less than 2 seconds.  Neurological:     Mental Status: She is alert and oriented to person, place, and time.     Motor: No abnormal muscle tone.       ____________________________________________   LABS (all labs ordered are listed, but only abnormal results are displayed)  Labs Reviewed  BASIC METABOLIC PANEL - Abnormal; Notable for the following components:      Result Value   Glucose, Bld 106 (*)    Calcium 8.2 (*)    All other components within normal limits  CBC  HEPATIC FUNCTION PANEL  LIPASE, BLOOD  POC URINE PREG, ED  TROPONIN I (HIGH SENSITIVITY)  TROPONIN I (HIGH SENSITIVITY)    ____________________________________________  EKG: EKG: Normal sinus rhythm, VR 92. PR 160, QRS 76, QTc 435. No acute ST elevations or depressions ________________________________________  RADIOLOGY All imaging, including plain films, CT scans, and ultrasounds, independently reviewed by me,  and interpretations confirmed via formal radiology reads.  ED MD interpretation:   CXR: Clear  Official radiology report(s): DG Chest 2 View  Result Date: 11/26/2019 CLINICAL DATA:  Chest pain EXAM: CHEST - 2 VIEW COMPARISON:  September 19, 2016 FINDINGS: Lungs are clear. Heart size and pulmonary vascularity are normal. No adenopathy. No pneumothorax. No bone lesions. IMPRESSION: Lungs clear.  Cardiac silhouette within normal limits. Electronically Signed   By: September 21, 2016 III M.D.   On: 11/26/2019 09:42    ____________________________________________  PROCEDURES   Procedure(s) performed (including Critical Care):  Procedures  ____________________________________________  INITIAL IMPRESSION / MDM / ASSESSMENT AND PLAN / ED COURSE  As part of  my medical decision making, I reviewed the following data within the electronic MEDICAL RECORD NUMBER Nursing notes reviewed and incorporated, Old chart reviewed, Notes from prior ED visits, and [place] Controlled Substance Database       *Jacqueline Walsh was evaluated in Emergency Department on 11/26/2019 for the symptoms described in the history of present illness. She was evaluated in the context of the global COVID-19 pandemic, which necessitated consideration that the patient might be at risk for infection with the SARS-CoV-2 virus that causes COVID-19. Institutional protocols and algorithms that pertain to the evaluation of patients at risk for COVID-19 are in a state of rapid change based on information released by regulatory bodies including the CDC and federal and state organizations. These policies and algorithms were followed during the patient's care in the ED.  Some ED evaluations and interventions may be delayed as a result of limited staffing during the pandemic.*     Medical Decision Making:  39 yo F here with CP. On arrival, pt c/o sharp, tearing, ripping back pain with BP 190/100. While I suspect sx are likely multifactorial but primarily  stressor/anxiety related with exacerbation of underlying essential hypertension, must consider aortic dissection given h/o autoimmune disease (Hashimoto's), tearing pain, HTN. Will check CT.  CT delayed due to allergy to contrast media. Discussed with Radiology - pt will be able to be premedicated prior to the time she would be able to undergo MRI or U/S. Will premedicate. Repeat trop neg. Labs otherwise are reassuring.  Will plan to f/u CT. If negative, d/c with brief course of anxiolytics and outpt f/u for repeat BP check.  ____________________________________________  FINAL CLINICAL IMPRESSION(S) / ED DIAGNOSES  Final diagnoses:  None     MEDICATIONS GIVEN DURING THIS VISIT:  Medications - No data to display   ED Discharge Orders    None       Note:  This document was prepared using Dragon voice recognition software and may include unintentional dictation errors.   Shaune Pollack, MD 11/26/19 [phone]

## 2019-11-26 NOTE — ED Notes (Signed)
Called CT to inform them that pt was given Solu-Cortef at 1310.

## 2019-11-26 NOTE — ED Notes (Signed)
Pt trx to CT.  

## 2019-11-26 NOTE — Discharge Instructions (Signed)
As we discussed, I'd recommend the following:  Start taking the Ativan nightly to help with sleep and/or stress. You can take this up to three times daily as well for anxiety, but be aware it may make you tired. You should not drive or operate machinery while taking the Ativan.  Keep a log of your blood pressures at home. I'd recommend giving it 1-2 days before checking, then checking at the same time every day.   Call your PCP to set up a follow-up in the next week. If your BP does not improve with rest and anxiety medications, you may need to go back on a BP medication

## 2019-11-26 NOTE — ED Triage Notes (Signed)
Here for central chest pain that started at 3 AM.  Pt had similar pains previous when had HTN but came off meds because bp had normalized.  BP has been elevating again per pt. Pain is constant.  Unlabored. BP up in triage. Color WNL.

## 2019-11-30 ENCOUNTER — Encounter: Payer: Self-pay | Admitting: Obstetrics and Gynecology

## 2019-11-30 DIAGNOSIS — Z30013 Encounter for initial prescription of injectable contraceptive: Secondary | ICD-10-CM

## 2019-12-01 ENCOUNTER — Telehealth: Payer: Self-pay

## 2019-12-01 NOTE — Telephone Encounter (Signed)
Pt calling wanting to speak with [facility] about her options. States she spoke with [facility], please advise

## 2019-12-01 NOTE — Telephone Encounter (Signed)
LMTRC

## 2019-12-04 NOTE — Telephone Encounter (Signed)
Patient is returning call from [facility]. Patient is aware ABC is out of the office and returns on Monday.

## 2019-12-07 MED ORDER — MEDROXYPROGESTERONE ACETATE 150 MG/ML IM SUSY: 150.0000 mg | PREFILLED_SYRINGE | Freq: Once | INTRAMUSCULAR | 0 refills | Status: DC

## 2019-12-07 NOTE — Telephone Encounter (Signed)
See telephone note.

## 2019-12-07 NOTE — Telephone Encounter (Signed)
Spoke with pt re: tx options for menorrhagia. Tried OCPs 3/21, but had elevated BP with sprintec. BP still elevated even though stopped OCPs about 10 days ago. BP was 100/70 at office visit before starting OCPs. Suggested no caffeine, decrease sodium in diet. See if BP resolves. If not, f/u with PCP for mgmt.  Can't have estrogen products. Pt wants to do depo. Rx eRxd. Finishing bleeding now, s/p TL. RTO with nurse for depo inj vs local nurse friend. Rx eRxd. F/u prn.  Pt also with facial hair with normal testosterone levels. See if depo helps with hair (probably not) but can try adding spironolactone prn. Needs electrolysis/laser hair rem for current follicles.   Meds ordered this encounter  Medications  . medroxyPROGESTERone Acetate 150 MG/ML SUSY    Sig: Inject 1 mL (150 mg total) into the muscle once for 1 dose.    Dispense:  1 mL    Refill:  0    Order Specific Question:   Supervising Provider    Answer:   Nadara Mustard [[id number]]

## 2019-12-15 ENCOUNTER — Encounter: Payer: Self-pay | Admitting: Obstetrics and Gynecology

## 2020-01-22 ENCOUNTER — Encounter: Payer: Self-pay | Admitting: Obstetrics and Gynecology

## 2020-02-12 ENCOUNTER — Encounter: Payer: Self-pay | Admitting: Obstetrics and Gynecology

## 2020-03-09 ENCOUNTER — Encounter: Payer: Self-pay | Admitting: Obstetrics and Gynecology

## 2020-03-10 ENCOUNTER — Telehealth: Payer: Self-pay | Admitting: Obstetrics and Gynecology

## 2020-03-10 NOTE — Telephone Encounter (Signed)
Patient is scheduled for Mirena placement with ABC on 03/22/20

## 2020-03-14 ENCOUNTER — Other Ambulatory Visit: Payer: Self-pay

## 2020-03-14 ENCOUNTER — Encounter: Payer: Self-pay | Admitting: Family Medicine

## 2020-03-14 ENCOUNTER — Ambulatory Visit (INDEPENDENT_AMBULATORY_CARE_PROVIDER_SITE_OTHER): Payer: 59 | Admitting: Family Medicine

## 2020-03-14 VITALS — BP 160/100 | HR 87 | Temp 99.3°F | Resp 16 | Ht 64.0 in | Wt 190.6 lb

## 2020-03-14 DIAGNOSIS — E063 Autoimmune thyroiditis: Secondary | ICD-10-CM

## 2020-03-14 DIAGNOSIS — I10 Essential (primary) hypertension: Secondary | ICD-10-CM | POA: Diagnosis not present

## 2020-03-14 MED ORDER — LISINOPRIL 20 MG PO TABS: 20.0000 mg | ORAL_TABLET | Freq: Every day | ORAL | 2 refills | Status: DC

## 2020-03-14 NOTE — Progress Notes (Signed)
Subjective:    Patient ID: Jacqueline Walsh, female    DOB: [birth year], 39 y.o.   MRN: [id number]  Jacqueline Walsh is a 39 y.o. female presenting on 03/14/2020 for Hypertension (03/11/2020-192/118 mm/hg pulse 103 bpm--today HA--184/125 mm/hg --Dizzyness for couple of seconds )  Previous PCP Wilhelmina Mcardle, AGPCNP-BC   HPI   CHRONIC HTN: Reports prior elevated BP in past, prior PCP Wilhelmina Mcardle, AGPCNP-BC had treated in past, was on low dose, Lisinopril 10mg  daily. - Recent history [facility] put her on low dose OCP, had elevated BP in 11/2019, evaluation. Stopped OCP and BP improved. She received Depo Provera injection 11/2019. They will pursue IUD next week. - Admits poor sleep at night, waking up few nights. - Checking home BP readings. - She has stopped salt and sodium. Drinking more water. Working on exercises - but admits some limitation with winded or dyspnea with exertion and some chest heaviness. - Admits one episode last week with doing fairly well, sitting on couch and then got up and felt suddenly very dizzy and husband caught her. BP at 192/118 at that time. Persistently elevated for past 3+ weeks. Checks home readings on arm cuff. Current Meds - None   Lifestyle: - Diet: drinks mostly water, does some no sugar added soda sprite with some caffeine mostly 1x daily. No other substances tobacco, alcohol, nicotine. - Exercise: sedentary mostly, until tried some exercise. Admits headache today when woke up, seems episodic headache. No fam history of early heart attack or heart disease. Denies CP, dyspnea, edema, lightheadedness  Hashimoto's Thyroid Previously followed by Dr 12/2019 ENT Last lab 09/2019 showed normal TSH Free T4 labs. She is still taking Levothyroxine 10/2019 daily  Dr is retiring she will need Elenore Rota to manage thyroid.  Additionally dx with Vitiligo, autoimmune  Health Maintenance: Due COVID19 vaccine.  Depression screen [facility] 2/9 03/14/2020 02/17/2018    Decreased Interest 0 0  Down, Depressed, Hopeless 0 0  PHQ - 2 Score 0 0    Social History   Tobacco Use  . Smoking status: Never Smoker  . Smokeless tobacco: Never Used  Vaping Use  . Vaping Use: Never used  Substance Use Topics  . Alcohol use: No    Comment: occas  . Drug use: No    Review of Systems Per HPI unless specifically indicated above     Objective:    BP (!) 160/100 (BP Location: Right Arm, Patient Position: Sitting, Cuff Size: Large)   Pulse 87   Temp 99.3 F (37.4 C) (Temporal)   Resp 16   Ht 5\' 4"  (1.626 m)   Wt 190 lb 9.6 oz (86.5 kg)   SpO2 99%   BMI 32.72 kg/m   Wt Readings from Last 3 Encounters:  03/14/20 190 lb 9.6 oz (86.5 kg)  11/26/19 185 lb (83.9 kg)  11/11/19 185 lb (83.9 kg)    Physical Exam Vitals and nursing note reviewed.  Constitutional:      General: She is not in acute distress.    Appearance: She is well-developed. She is obese. She is not diaphoretic.     Comments: Well-appearing, comfortable, cooperative  HENT:     Head: Normocephalic and atraumatic.  Eyes:     General:        Right eye: No discharge.        Left eye: No discharge.     Conjunctiva/sclera: Conjunctivae normal.  Neck:     Thyroid: No thyromegaly.  Cardiovascular:  Rate and Rhythm: Normal rate and regular rhythm.     Heart sounds: Normal heart sounds. No murmur heard.   Pulmonary:     Effort: Pulmonary effort is normal. No respiratory distress.     Breath sounds: Normal breath sounds. No wheezing or rales.  Musculoskeletal:        General: Normal range of motion.     Cervical back: Normal range of motion and neck supple.  Lymphadenopathy:     Cervical: No cervical adenopathy.  Skin:    General: Skin is warm and dry.     Findings: No erythema or rash.  Neurological:     Mental Status: She is alert and oriented to person, place, and time.  Psychiatric:        Behavior: Behavior normal.     Comments: Well groomed, good eye contact, normal  speech and thoughts       Results for orders placed or performed during the hospital encounter of 11/26/19  Basic metabolic panel  Result Value Ref Range   Sodium 136 135 - 145 mmol/L   Potassium 3.5 3.5 - 5.1 mmol/L   Chloride 103 98 - 111 mmol/L   CO2 24 22 - 32 mmol/L   Glucose, Bld 106 (H) 70 - 99 mg/dL   BUN 9 6 - 20 mg/dL   Creatinine, Ser 1.61 0.44 - 1.00 mg/dL   Calcium 8.2 (L) 8.9 - 10.3 mg/dL   GFR calc non Af Amer >60 >60 mL/min   GFR calc Af Amer >60 >60 mL/min   Anion gap 9 5 - 15  CBC  Result Value Ref Range   WBC 7.3 4.0 - 10.5 K/uL   RBC 4.73 3.87 - 5.11 MIL/uL   Hemoglobin 12.6 12.0 - 15.0 g/dL   HCT 09.6 36 - 46 %   MCV 82.5 80.0 - 100.0 fL   MCH 26.6 26.0 - 34.0 pg   MCHC 32.3 30.0 - 36.0 g/dL   RDW 04.5 40.9 - 81.1 %   Platelets 318 150 - 400 K/uL   nRBC 0.0 0.0 - 0.2 %  Hepatic function panel  Result Value Ref Range   Total Protein 8.1 6.5 - 8.1 g/dL   Albumin 4.2 3.5 - 5.0 g/dL   AST 25 15 - 41 U/L   ALT 18 0 - 44 U/L   Alkaline Phosphatase 76 38 - 126 U/L   Total Bilirubin 0.6 0.3 - 1.2 mg/dL   Bilirubin, Direct <9.1 0.0 - 0.2 mg/dL   Indirect Bilirubin NOT CALCULATED 0.3 - 0.9 mg/dL  Lipase, blood  Result Value Ref Range   Lipase 25 11 - 51 U/L  Pregnancy, urine POC  Result Value Ref Range   Preg Test, Ur NEGATIVE NEGATIVE  Troponin I (High Sensitivity)  Result Value Ref Range   Troponin I (High Sensitivity) 8 <18 ng/L  Troponin I (High Sensitivity)  Result Value Ref Range   Troponin I (High Sensitivity) 8 <18 ng/L      Assessment & Plan:   Problem List Items Addressed This Visit    Hashimoto's disease    Last lab normal in 09/2019 Managed by ENT Dr Elenore Rota On levothyroxine, no issues or concerns, still adhering to dose. Will consider repeat labs sooner if still uncontrolled HTN      Essential hypertension - Primary    Uncontrolled. Mild improve in office but still >160/100 - Home BP readings reviewed still uncontrolled    Complication with hypothyroidism. History of elevated BP on OCP  in past, now has end of Depo Provera 3 months in system, nearly out    Plan:  1. Re order Lisinopril - will go up to 20mg  daily now, advised dosing, can consider half to 10 or up to double for 40mg  if indicated, based on home readings 2. Encourage improved lifestyle - low sodium diet, regular exercise gradually improve, caution deconditioning 3. Continue monitor BP outside office, bring readings to next visit, if persistently >140/90 or new symptoms notify office sooner 4. Follow-up by message or phone within 1-2 weeks with BP readings, if doing well, can schedule routine f/u and lab panel in future 4-6 weeks or 3 months, then if not doing well we can schedule sooner and do further work up or referral if indicated       Relevant Medications   lisinopril (ZESTRIL) 20 MG tablet      Meds ordered this encounter  Medications  . lisinopril (ZESTRIL) 20 MG tablet    Sig: Take 1 tablet (20 mg total) by mouth daily.    Dispense:  30 tablet    Refill:  2     Follow up plan: Return in about 2 weeks (around 03/28/2020), or if symptoms worsen or fail to improve, for HTN.  No labs ordered today. Will follow-up on mychart or by phone 1-2 weeks to see if BP readings are improved. May order additional lab work with CMET, CBC, TSH Free T4 if indicated if BP not controlled, otherwise, we can plan for future annual physical / wellness in future when ready for full lab panel if indicated   , DO [facility] 03/14/2020, 4:21 PM

## 2020-03-14 NOTE — Assessment & Plan Note (Signed)
Last lab normal in 09/2019 Managed by ENT Dr Elenore Rota On levothyroxine, no issues or concerns, still adhering to dose. Will consider repeat labs sooner if still uncontrolled HTN

## 2020-03-14 NOTE — Assessment & Plan Note (Addendum)
Uncontrolled. Mild improve in office but still >160/100 - Home BP readings reviewed still uncontrolled  Complication with hypothyroidism. History of elevated BP on OCP in past, now has end of Depo Provera 3 months in system, nearly out    Plan:  1. Re order Lisinopril - will go up to 20mg  daily now, advised dosing, can consider half to 10 or up to double for 40mg  if indicated, based on home readings 2. Encourage improved lifestyle - low sodium diet, regular exercise gradually improve, caution deconditioning 3. Continue monitor BP outside office, bring readings to next visit, if persistently >140/90 or new symptoms notify office sooner 4. Follow-up by message or phone within 1-2 weeks with BP readings, if doing well, can schedule routine f/u and lab panel in future 4-6 weeks or 3 months, then if not doing well we can schedule sooner and do further work up or referral if indicated

## 2020-03-14 NOTE — Patient Instructions (Addendum)
Thank you for coming to the office today.  Restart Lisinopril for blood pressure, 20mg  daily, higher than last dose. If too strong can cut in half, for 10mg . If not strong enough, persistent >140 or bottom number >90, can take 2 or 40mg .  Check BP closely Correspond with me on mychart within 1-2 weeks  We can return for follow-up and lab testing and or referral  OR if doing much better, we can schedule a physical / lab panel   Please schedule a Follow-up Appointment to: Return in about 2 weeks (around 03/28/2020), or if symptoms worsen or fail to improve, for HTN.  If you have any other questions or concerns, please feel free to call the office or send a message through MyChart. You may also schedule an earlier appointment if necessary.  Additionally, you may be receiving a survey about your experience at our office within a few days to 1 week by e-mail or mail. We value your feedback.  , DO [facility] [place], 05/28/2020

## 2020-03-15 NOTE — Telephone Encounter (Signed)
Noted. Mirena reserved for this patient. 

## 2020-03-22 ENCOUNTER — Ambulatory Visit (INDEPENDENT_AMBULATORY_CARE_PROVIDER_SITE_OTHER): Payer: 59 | Admitting: Obstetrics and Gynecology

## 2020-03-22 ENCOUNTER — Encounter: Payer: Self-pay | Admitting: Obstetrics and Gynecology

## 2020-03-22 ENCOUNTER — Other Ambulatory Visit: Payer: Self-pay

## 2020-03-22 VITALS — BP 124/80 | Ht 64.0 in | Wt 189.0 lb

## 2020-03-22 DIAGNOSIS — N920 Excessive and frequent menstruation with regular cycle: Secondary | ICD-10-CM

## 2020-03-22 DIAGNOSIS — Z30430 Encounter for insertion of intrauterine contraceptive device: Secondary | ICD-10-CM | POA: Diagnosis not present

## 2020-03-22 MED ORDER — MIRENA (52 MG) 20 MCG/24HR IU IUD: 1.0000 | INTRAUTERINE_SYSTEM | Freq: Once | INTRAUTERINE | 0 refills | Status: AC

## 2020-03-22 NOTE — Progress Notes (Signed)
   Chief Complaint  Patient presents with  . Contraception    Mirena insertion     IUD PROCEDURE NOTE:  Jacqueline Walsh is a 39 y.o. [phone] here for Mirena  IUD insertion for menorrhagia. Did OCPs with elevated BPs, tried depo with BTB and would rather have IUD. Last depo 4/21. Had amenorrhea initially, then light BTB for past 2 months. S/p TL.   BP 124/80   Ht 5\' 4"  (1.626 m)   Wt 189 lb (85.7 kg)   BMI 32.44 kg/m   IUD Insertion Procedure Note Patient identified, informed consent performed, consent signed.   Discussed risks of irregular bleeding, cramping, infection, malpositioning or misplacement of the IUD outside the uterus which may require further procedure such as laparoscopy, risk of failure <1%. Time out was performed.    Speculum placed in the vagina.  Cervix visualized.  Cleaned with Betadine x 2.  Grasped anteriorly with a single tooth tenaculum.  Uterus sounded to 10.0 cm.   IUD placed per manufacturer's recommendations.  Strings trimmed to 3 cm. Tenaculum was removed, good hemostasis noted.  Patient tolerated procedure well.   ASSESSMENT:  Encounter for IUD insertion - Plan: levonorgestrel (MIRENA, 52 MG,) 20 MCG/24HR IUD  Menorrhagia with regular cycle - Plan: levonorgestrel (MIRENA, 52 MG,) 20 MCG/24HR IUD; Try IUD for sx control. F/u in 4 wks.    Meds ordered this encounter  Medications  . levonorgestrel (MIRENA, 52 MG,) 20 MCG/24HR IUD    Sig: 1 Intra Uterine Device (1 each total) by Intrauterine route once for 1 dose.    Dispense:  1 Intra Uterine Device    Refill:  0    Order Specific Question:   Supervising Provider    Answer:   Nadara Mustard     Plan:  Patient was given post-procedure instructions.  She was advised to have backup contraception for one week.   Call if you are having increasing pain, cramps or bleeding or if you have a fever greater than 100.4 degrees F., shaking chills, nausea or vomiting. Patient was also asked to check  IUD strings periodically and follow up in 4 weeks for IUD check.  Return in about 4 weeks (around 04/19/2020) for IUD f/u.  Ronny Korff B. Estefan Pattison, PA-C 03/22/2020 5:05 PM

## 2020-03-22 NOTE — Patient Instructions (Signed)
I value your feedback and entrusting us with your care. If you get a [facility] patient survey, I would appreciate you taking the time to let us know about your experience today. Thank you!  As of July 30, 2019, your lab results will be released to your MyChart immediately, before I even have a chance to see them. Please give me time to review them and contact you if there are any abnormalities. Thank you for your patience.   [facility] [phone]  Instructions after IUD insertion  Most women experience no significant problems after insertion of an IUD, however minor cramping and spotting for a few days is common. Cramps may be treated with ibuprofen 800mg every 8 hours or Tylenol 650 mg every 4 hours. Contact [facility] immediately if you experience any of the following symptoms during the next week: temperature >99.6 degrees, worsening pelvic pain, abdominal pain, fainting, unusually heavy vaginal bleeding, foul vaginal discharge, or if you think you have expelled the IUD.  Nothing inserted in the vagina for 48 hours. You will be scheduled for a follow up visit in approximately four weeks.  You should check monthly to be sure you can feel the IUD strings in the upper vagina. If you are having a monthly period, try to check after each period. If you cannot feel the IUD strings,  contact [facility] immediately so we can do an exam to determine if the IUD has been expelled.   Please use backup protection until we can confirm the IUD is in place.  Call [facility] if you are exposed to or diagnosed with a sexually transmitted infection, as we will need to discuss whether it is safe for you to continue using an IUD.   

## 2020-04-20 ENCOUNTER — Encounter: Payer: Self-pay | Admitting: Obstetrics and Gynecology

## 2020-04-20 ENCOUNTER — Other Ambulatory Visit: Payer: Self-pay

## 2020-04-20 ENCOUNTER — Ambulatory Visit (INDEPENDENT_AMBULATORY_CARE_PROVIDER_SITE_OTHER): Payer: 59 | Admitting: Obstetrics and Gynecology

## 2020-04-20 VITALS — BP 130/80 | Ht 64.0 in | Wt 189.0 lb

## 2020-04-20 DIAGNOSIS — Z30431 Encounter for routine checking of intrauterine contraceptive device: Secondary | ICD-10-CM | POA: Diagnosis not present

## 2020-04-20 NOTE — Progress Notes (Signed)
° °  Chief Complaint  Patient presents with   IUD check     History of Present Illness:  Jacqueline Walsh is a 39 y.o. that had a Mirena IUD placed approximately 1 month ago. Since that time, she denies dyspareunia, pelvic pain,  vaginal d/c, heavy bleeding. Had light bleeding after insertion and now again for the past 2 days (period is due). Minimal dysmen. Doing well.  Review of Systems  Constitutional: Negative for fever.  Gastrointestinal: Negative for blood in stool, constipation, diarrhea, nausea and vomiting.  Genitourinary: Positive for vaginal bleeding. Negative for dyspareunia, dysuria, flank pain, frequency, hematuria, urgency, vaginal discharge and vaginal pain.  Musculoskeletal: Negative for back pain.  Skin: Negative for rash.    Physical Exam:  BP 130/80    Ht 5\' 4"  (1.626 m)    Wt 189 lb (85.7 kg)    LMP 03/28/2020 (Exact Date)    BMI 32.44 kg/m  Body mass index is 32.44 kg/m.  Pelvic exam:  Two IUD strings present seen coming from the cervical os. EGBUS, vaginal vault and cervix: within normal limits   Assessment:   Encounter for routine checking of intrauterine contraceptive device (IUD)  IUD strings present in proper location; pt doing well  Plan: F/u if any signs of infection or can no longer feel the strings.   Tess Potts B. Kamiah Fite, PA-C 04/20/2020 3:40 PM

## 2020-04-20 NOTE — Patient Instructions (Signed)
I value your feedback and entrusting us with your care. If you get a  patient survey, I would appreciate you taking the time to let us know about your experience today. Thank you!  As of July 30, 2019, your lab results will be released to your MyChart immediately, before I even have a chance to see them. Please give me time to review them and contact you if there are any abnormalities. Thank you for your patience.  

## 2020-07-18 DIAGNOSIS — I10 Essential (primary) hypertension: Secondary | ICD-10-CM

## 2020-07-18 MED ORDER — AMLODIPINE BESYLATE 10 MG PO TABS: 10.0000 mg | ORAL_TABLET | Freq: Every day | ORAL | 2 refills | Status: DC

## 2020-10-11 DIAGNOSIS — E063 Autoimmune thyroiditis: Secondary | ICD-10-CM

## 2020-10-11 DIAGNOSIS — R7309 Other abnormal glucose: Secondary | ICD-10-CM

## 2020-10-11 DIAGNOSIS — I10 Essential (primary) hypertension: Secondary | ICD-10-CM

## 2020-10-11 DIAGNOSIS — Z0000 Encounter for general adult medical examination without abnormal findings: Secondary | ICD-10-CM

## 2020-10-14 ENCOUNTER — Other Ambulatory Visit: Payer: 59

## 2020-10-20 ENCOUNTER — Other Ambulatory Visit: Payer: 59

## 2020-10-20 ENCOUNTER — Other Ambulatory Visit: Payer: Self-pay

## 2020-10-21 LAB — CBC WITH DIFFERENTIAL/PLATELET
Absolute Monocytes: 436 cells/uL (ref 200–950)
Basophils Absolute: 40 cells/uL (ref 0–200)
Basophils Relative: 0.6 %
Eosinophils Absolute: 208 cells/uL (ref 15–500)
Eosinophils Relative: 3.1 %
HCT: 43.9 % (ref 35.0–45.0)
Hemoglobin: 14.5 g/dL (ref 11.7–15.5)
Lymphs Abs: 1950 cells/uL (ref 850–3900)
MCH: 29.6 pg (ref 27.0–33.0)
MCHC: 33 g/dL (ref 32.0–36.0)
MCV: 89.6 fL (ref 80.0–100.0)
MPV: 10.6 fL (ref 7.5–12.5)
Monocytes Relative: 6.5 %
Neutro Abs: 4067 cells/uL (ref 1500–7800)
Neutrophils Relative %: 60.7 %
Platelets: 294 10*3/uL (ref 140–400)
RBC: 4.9 10*6/uL (ref 3.80–5.10)
RDW: 13.2 % (ref 11.0–15.0)
Total Lymphocyte: 29.1 %
WBC: 6.7 10*3/uL (ref 3.8–10.8)

## 2020-10-21 LAB — HEMOGLOBIN A1C
Hgb A1c MFr Bld: 5.1 % of total Hgb (ref <5.7)
Mean Plasma Glucose: 100 mg/dL
eAG (mmol/L): 5.5 mmol/L

## 2020-10-21 LAB — LIPID PANEL
Cholesterol: 183 mg/dL (ref <200)
HDL: 40 mg/dL — ABNORMAL LOW (ref >=50)
LDL Cholesterol (Calc): 117 mg/dL (calc) — ABNORMAL HIGH
Non-HDL Cholesterol (Calc): 143 mg/dL (calc) — ABNORMAL HIGH (ref <130)
Total CHOL/HDL Ratio: 4.6 (calc) (ref <5.0)
Triglycerides: 138 mg/dL (ref <150)

## 2020-10-21 LAB — THYROID PANEL WITH TSH
Free Thyroxine Index: 2.9 (ref 1.4–3.8)
T3 Uptake: 29 % (ref 22–35)
T4, Total: 9.9 ug/dL (ref 5.1–11.9)
TSH: 4.4 mIU/L

## 2020-10-21 LAB — COMPLETE METABOLIC PANEL WITH GFR
AG Ratio: 1.6 (calc) (ref 1.0–2.5)
ALT: 22 U/L (ref 6–29)
AST: 19 U/L (ref 10–30)
Albumin: 4.4 g/dL (ref 3.6–5.1)
Alkaline phosphatase (APISO): 69 U/L (ref 31–125)
BUN: 11 mg/dL (ref 7–25)
CO2: 25 mmol/L (ref 20–32)
Calcium: 9.3 mg/dL (ref 8.6–10.2)
Chloride: 105 mmol/L (ref 98–110)
Creat: 0.65 mg/dL (ref 0.50–1.10)
GFR, Est African American: 131 mL/min/{1.73_m2} (ref >=60)
GFR, Est Non African American: 113 mL/min/{1.73_m2} (ref >=60)
Globulin: 2.8 g/dL (calc) (ref 1.9–3.7)
Glucose, Bld: 99 mg/dL (ref 65–99)
Potassium: 3.5 mmol/L (ref 3.5–5.3)
Sodium: 140 mmol/L (ref 135–146)
Total Bilirubin: 0.6 mg/dL (ref 0.2–1.2)
Total Protein: 7.2 g/dL (ref 6.1–8.1)

## 2020-10-26 ENCOUNTER — Other Ambulatory Visit: Payer: Self-pay

## 2020-10-26 ENCOUNTER — Ambulatory Visit (INDEPENDENT_AMBULATORY_CARE_PROVIDER_SITE_OTHER): Payer: 59 | Admitting: Family Medicine

## 2020-10-26 ENCOUNTER — Encounter: Payer: Self-pay | Admitting: Family Medicine

## 2020-10-26 VITALS — BP 123/81 | HR 80 | Ht 64.0 in | Wt 184.4 lb

## 2020-10-26 DIAGNOSIS — E063 Autoimmune thyroiditis: Secondary | ICD-10-CM

## 2020-10-26 DIAGNOSIS — Z0000 Encounter for general adult medical examination without abnormal findings: Secondary | ICD-10-CM

## 2020-10-26 DIAGNOSIS — I10 Essential (primary) hypertension: Secondary | ICD-10-CM | POA: Diagnosis not present

## 2020-10-26 DIAGNOSIS — L659 Nonscarring hair loss, unspecified: Secondary | ICD-10-CM

## 2020-10-26 MED ORDER — LEVOTHYROXINE SODIUM 150 MCG PO TABS: 150.0000 ug | ORAL_TABLET | Freq: Every day | ORAL | 3 refills | Status: DC

## 2020-10-26 MED ORDER — AMLODIPINE BESYLATE 10 MG PO TABS: 10.0000 mg | ORAL_TABLET | Freq: Every day | ORAL | 3 refills | Status: DC

## 2020-10-26 NOTE — Progress Notes (Signed)
Subjective:    Patient ID: Jacqueline Walsh, female    DOB: 1980-09-05, 40 y.o.   MRN: [id number]  Jacqueline Walsh is a 40 y.o. female presenting on 10/26/2020 for Annual Exam and Hypertension   HPI   Here for Annual Physical and Lab Review.  BP med was changed from Lisinopril to Amlodipine She had high BP and symptoms, 06/2020, mychart message, ultimately we stopped Lisinopril and Switched on to Amlodipine she inc to 10mg  daily and has done well, needs refills - Checking home BP readings. - She has stopped salt and sodium. Drinking more water. Working on exercises Improved overall no further high readings or dizzy spell Current Meds - Amlodipine 10mg  daily No fam history of early heart attack or heart disease. Denies CP, dyspnea, edema, lightheadedness   Hashimoto's Thyroid Previously followed by Dr ENT Last lab shows full thyroid panel with normal TSH 4.40 (high end of normal) and normal T4 free range. She is still taking Levothyroxine daily  Dr Terrilyn Saver is retiring she will need to manage thyroid.  Elevated LDL Cholesterol Lab with LDL 117, mild elevation, no prior readings.  Additionally dx with Vitiligo, autoimmune  Health Maintenance:  Upcoming GYN apt with Elenore Rota Copland PA at [facility] for Pap Smear  Depression screen [facility] 2/9 03/14/2020 02/17/2018  Decreased Interest 0 0  Down, Depressed, Hopeless 0 0  PHQ - 2 Score 0 0    Past Medical History:  Diagnosis Date  . Hypertension   . Hypothyroidism   . Migraine, unspecified, without mention of intractable migraine without mention of status migrainosus   . Thyroid disease    Past Surgical History:  Procedure Laterality Date  . LAPAROSCOPIC APPENDECTOMY N/A 01/07/2018   Procedure: APPENDECTOMY LAPAROSCOPIC;  Surgeon: 04/20/2018, MD;  Location: [facility] ORS;  Service: General;  Laterality: N/A;  . TUBAL LIGATION     Social History   Socioeconomic History  . Marital status: Married    Spouse  name: Not on file  . Number of children: Not on file  . Years of education: Not on file  . Highest education level: Not on file  Occupational History  . Not on file  Tobacco Use  . Smoking status: Never Smoker  . Smokeless tobacco: Never Used  Vaping Use  . Vaping Use: Never used  Substance and Sexual Activity  . Alcohol use: No    Comment: occas  . Drug use: No  . Sexual activity: Yes    Birth control/protection: None, Surgical, I.U.D.    Comment: Mirena, Tubal ligation  Other Topics Concern  . Not on file  Social History Narrative  . Not on file   Social Determinants of Health   Financial Resource Strain: Not on file  Food Insecurity: Not on file  Transportation Needs: Not on file  Physical Activity: Not on file  Stress: Not on file  Social Connections: Not on file  Intimate Partner Violence: Not on file   Family History  Problem Relation Age of Onset  . Hyperlipidemia Mother   . Transient ischemic attack Mother   . Thyroid disease Father   . Hypercholesterolemia Father   . Thyroid disease Sister   . Bladder Cancer Maternal Grandmother   . Hypertension Neg Hx   . Breast cancer Neg Hx   . Colon cancer Neg Hx    Current Outpatient Medications on File Prior to Visit  Medication Sig  . Multiple Vitamin (MULTIVITAMIN WITH MINERALS) TABS tablet  Take 1 tablet by mouth daily.   Marland Kitchen levonorgestrel (MIRENA, 52 MG,) 20 MCG/24HR IUD 1 Intra Uterine Device (1 each total) by Intrauterine route once for 1 dose.   No current facility-administered medications on file prior to visit.    Review of Systems  Constitutional: Negative for activity change, appetite change, chills, diaphoresis, fatigue and fever.  HENT: Negative for congestion and hearing loss.   Eyes: Negative for visual disturbance.  Respiratory: Negative for apnea, cough, choking, chest tightness, shortness of breath and wheezing.   Cardiovascular: Negative for chest pain, palpitations and leg swelling.   Gastrointestinal: Negative for abdominal pain, constipation, diarrhea, nausea and vomiting.  Genitourinary: Negative for difficulty urinating, dysuria, frequency and hematuria.  Musculoskeletal: Negative for arthralgias and neck pain.  Skin: Negative for rash.  Allergic/Immunologic: Negative for environmental allergies.  Neurological: Negative for dizziness, weakness, light-headedness, numbness and headaches.  Hematological: Negative for adenopathy.  Psychiatric/Behavioral: Negative for behavioral problems, dysphoric mood and sleep disturbance. The patient is not nervous/anxious.    Per HPI unless specifically indicated above      Objective:    BP 123/81   Pulse 80   Ht 5\' 4"  (1.626 m)   Wt 184 lb 6.4 oz (83.6 kg)   SpO2 100%   BMI 31.65 kg/m   Wt Readings from Last 3 Encounters:  10/26/20 184 lb 6.4 oz (83.6 kg)  04/20/20 189 lb (85.7 kg)  03/22/20 189 lb (85.7 kg)    Physical Exam Vitals and nursing note reviewed.  Constitutional:      General: She is not in acute distress.    Appearance: She is well-developed and well-nourished. She is not diaphoretic.     Comments: Well-appearing, comfortable, cooperative  HENT:     Head: Normocephalic and atraumatic.     Mouth/Throat:     Mouth: Oropharynx is clear and moist.  Eyes:     General:        Right eye: No discharge.        Left eye: No discharge.     Extraocular Movements: EOM normal.     Conjunctiva/sclera: Conjunctivae normal.     Pupils: Pupils are equal, round, and reactive to light.  Neck:     Thyroid: No thyromegaly.  Cardiovascular:     Rate and Rhythm: Normal rate and regular rhythm.     Pulses: Intact distal pulses.     Heart sounds: Normal heart sounds. No murmur heard.   Pulmonary:     Effort: Pulmonary effort is normal. No respiratory distress.     Breath sounds: Normal breath sounds. No wheezing or rales.  Abdominal:     General: Bowel sounds are normal. There is no distension.     Palpations:  Abdomen is soft. There is no mass.     Tenderness: There is no abdominal tenderness.  Musculoskeletal:        General: No tenderness or edema. Normal range of motion.     Cervical back: Normal range of motion and neck supple.     Right lower leg: No edema.     Left lower leg: No edema.     Comments: Upper / Lower Extremities: - Normal muscle tone, strength bilateral upper extremities 5/5, lower extremities 5/5  Lymphadenopathy:     Cervical: No cervical adenopathy.  Skin:    General: Skin is warm and dry.     Findings: No erythema or rash.  Neurological:     Mental Status: She is alert and oriented to person,  place, and time.     Comments: Distal sensation intact to light touch all extremities  Psychiatric:        Mood and Affect: Mood and affect normal.        Behavior: Behavior normal.     Comments: Well groomed, good eye contact, normal speech and thoughts    Results for orders placed or performed in visit on 10/11/20  CBC with Differential/Platelet  Result Value Ref Range   WBC 6.7 3.8 - 10.8 Thousand/uL   RBC 4.90 3.80 - 5.10 Million/uL   Hemoglobin 14.5 11.7 - 15.5 g/dL   HCT 98.143.9 19.135.0 - 47.845.0 %   MCV 89.6 80.0 - 100.0 fL   MCH 29.6 27.0 - 33.0 pg   MCHC 33.0 32.0 - 36.0 g/dL   RDW 29.513.2 62.111.0 - 30.815.0 %   Platelets 294 140 - 400 Thousand/uL   MPV 10.6 7.5 - 12.5 fL   Neutro Abs 4,067 1,500 - 7,800 cells/uL   Lymphs Abs 1,950 850 - 3,900 cells/uL   Absolute Monocytes 436 200 - 950 cells/uL   Eosinophils Absolute 208 15 - 500 cells/uL   Basophils Absolute 40 0 - 200 cells/uL   Neutrophils Relative % 60.7 %   Total Lymphocyte 29.1 %   Monocytes Relative 6.5 %   Eosinophils Relative 3.1 %   Basophils Relative 0.6 %  COMPLETE METABOLIC PANEL WITH GFR  Result Value Ref Range   Glucose, Bld 99 65 - 99 mg/dL   BUN 11 7 - 25 mg/dL   Creat 6.570.65 8.460.50 - 9.621.10 mg/dL   GFR, Est Non African American 113 > OR = 60 mL/min/1.7373m2   GFR, Est African American 131 > OR = 60  mL/min/1.2673m2   BUN/Creatinine Ratio NOT APPLICABLE 6 - 22 (calc)   Sodium 140 135 - 146 mmol/L   Potassium 3.5 3.5 - 5.3 mmol/L   Chloride 105 98 - 110 mmol/L   CO2 25 20 - 32 mmol/L   Calcium 9.3 8.6 - 10.2 mg/dL   Total Protein 7.2 6.1 - 8.1 g/dL   Albumin 4.4 3.6 - 5.1 g/dL   Globulin 2.8 1.9 - 3.7 g/dL (calc)   AG Ratio 1.6 1.0 - 2.5 (calc)   Total Bilirubin 0.6 0.2 - 1.2 mg/dL   Alkaline phosphatase (APISO) 69 31 - 125 U/L   AST 19 10 - 30 U/L   ALT 22 6 - 29 U/L  Lipid panel  Result Value Ref Range   Cholesterol 183 <200 mg/dL   HDL 40 (L) > OR = 50 mg/dL   Triglycerides 952138 <841<150 mg/dL   LDL Cholesterol (Calc) 117 (H) mg/dL (calc)   Total CHOL/HDL Ratio 4.6 <5.0 (calc)   Non-HDL Cholesterol (Calc) 143 (H) <130 mg/dL (calc)  Thyroid Panel With TSH  Result Value Ref Range   T3 Uptake 29 22 - 35 %   T4, Total 9.9 5.1 - 11.9 mcg/dL   Free Thyroxine Index 2.9 1.4 - 3.8   TSH 4.40 mIU/L  Hemoglobin A1c  Result Value Ref Range   Hgb A1c MFr Bld 5.1 <5.7 % of total Hgb   Mean Plasma Glucose 100 mg/dL   eAG (mmol/L) 5.5 mmol/L      Assessment & Plan:   Problem List Items Addressed This Visit    Hashimoto's disease    Chronic hashimoto's hypothyroidism Well controlled in past Previously Managed by ENT Dr Elenore RotaJuengel - now retiring  Last lab showed normal Thyroid panel + TSH 10/2020 Refill levothyroxine  daily  Discussion that no sign or evidence of hair loss due to thyroid today, reviewed that she can start OTC supplements to help treat some natural hair loss vitamin D, Biotin etc. Continue current thyroid dosage  Also discussed other causes of non scarring hair loss with stress and other normal factors.       Relevant Medications   levothyroxine (SYNTHROID) 150 MCG tablet   Essential hypertension    Controlled HTN Home readings reviewed Complication with hypothyroidism. History of elevated BP on OCP in past Failed Lisinopril    Plan:  1. Continue  Amlodipine 10mg  daily 2. Encourage improved lifestyle - low sodium diet, regular exercise gradually improve, caution deconditioning 3. Continue monitor BP outside office, bring readings to next visit, if persistently >140/90 or new symptoms notify office sooner      Relevant Medications   amLODipine (NORVASC) 10 MG tablet    Other Visit Diagnoses    Annual physical exam    -  Primary   Non-scarring hair loss          Updated Health Maintenance information Reviewed recent lab results with patient Encouraged improvement to lifestyle with diet and exercise - Goal of weight loss  Future consider Dermatology if no improvement on hair loss or worsening.   Meds ordered this encounter  Medications  . amLODipine (NORVASC) 10 MG tablet    Sig: Take 1 tablet (10 mg total) by mouth daily.    Dispense:  90 tablet    Refill:  3  . levothyroxine (SYNTHROID) 150 MCG tablet    Sig: Take 1 tablet (150 mcg total) by mouth daily before breakfast.    Dispense:  90 tablet    Refill:  3     Follow up plan: Return in about 1 year (around 10/26/2021) for 1 year fasting lab only then 1 week later Annual Physical.    12/26/2021, DO [facility] 10/26/2020, 9:27 AM

## 2020-10-26 NOTE — Patient Instructions (Addendum)
Thank you for coming to the office today.  Vitamin D supplement can start at 5,000 daily (check Multivitamin) for 3 months, then reduce dose down to 2,000 daily for maintenance long term.  Also make sure getting plenty of Biotin - check multivitamin, can do an extra supplement if you want as well.  If no observable loss of hair, may still be within range of normal, if extra hair growth sometimes extra hair loss.  Normal thyroid panel   Rest of blood is all excellent, slightly elevated LDL cholesterol but no treatment required.  DUE for FASTING BLOOD WORK (no food or drink after midnight before the lab appointment, only water or coffee without cream/sugar on the morning of)  SCHEDULE "Lab Only" visit in the morning at the clinic for lab draw in 1 YEAR  - Make sure Lab Only appointment is at about 1 week before your next appointment, so that results will be available  For Lab Results, once available within 2-3 days of blood draw, you can can log in to MyChart online to view your results and a brief explanation. Also, we can discuss results at next follow-up visit.   Please schedule a Follow-up Appointment to: Return in about 1 year (around 10/26/2021) for 1 year fasting lab only then 1 week later Annual Physical.  If you have any other questions or concerns, please feel free to call the office or send a message through MyChart. You may also schedule an earlier appointment if necessary.  Additionally, you may be receiving a survey about your experience at our office within a few days to 1 week by e-mail or mail. We value your feedback.  Saralyn Pilar, DO [facility], [place]

## 2020-10-26 NOTE — Assessment & Plan Note (Addendum)
Controlled HTN Home readings reviewed Complication with hypothyroidism. History of elevated BP on OCP in past Failed Lisinopril    Plan:  1. Continue Amlodipine 10mg  daily 2. Encourage improved lifestyle - low sodium diet, regular exercise gradually improve, caution deconditioning 3. Continue monitor BP outside office, bring readings to next visit, if persistently >140/90 or new symptoms notify office sooner

## 2020-10-26 NOTE — Assessment & Plan Note (Signed)
Chronic hashimoto's hypothyroidism Well controlled in past Previously Managed by ENT Dr Elenore Rota - now retiring  Last lab showed normal Thyroid panel + TSH 10/2020 Refill levothyroxine daily  Discussion that no sign or evidence of hair loss due to thyroid today, reviewed that she can start OTC supplements to help treat some natural hair loss vitamin D, Biotin etc. Continue current thyroid dosage  Also discussed other causes of non scarring hair loss with stress and other normal factors.

## 2020-11-16 NOTE — Progress Notes (Signed)
PCP:  Smitty Cords, DO   Chief Complaint  Patient presents with  . Gynecologic Exam    No concerns     HPI:      [name] is a 40 y.o. No obstetric history on file. who LMP was No LMP recorded. (Menstrual status: IUD)., presents today for her annual examination.  Her menses are regular every 28-30 days, lasting 7 days spotting only with IUD; was 6-8 days of heavy flow, with 1/2 dollar sized clots last yr before IUD.  No BTB, mild dysmen. Mirena placed for menses 03/22/20  Sex activity: single partner, contraception - tubal ligation.  Last Pap: 11/11/19 Results were: no abnormalities /neg HPV DNA. No hx of abn paps.   There is no FH of breast cancer. There is no FH of ovarian cancer. The patient does do self-breast exams.  Tobacco use: The patient denies current or previous tobacco use. Alcohol use: none No drug use.  Exercise: moderately active  She does get adequate calcium and Vitamin D in her diet.   Past Medical History:  Diagnosis Date  . Hypertension   . Hypothyroidism   . Migraine, unspecified, without mention of intractable migraine without mention of status migrainosus   . Thyroid disease     Past Surgical History:  Procedure Laterality Date  . LAPAROSCOPIC APPENDECTOMY N/A 01/07/2018   Procedure: APPENDECTOMY LAPAROSCOPIC;  Surgeon: Leafy Ro, MD;  Location: [facility] ORS;  Service: General;  Laterality: N/A;  . TUBAL LIGATION      Family History  Problem Relation Age of Onset  . Hyperlipidemia Mother   . Transient ischemic attack Mother   . Thyroid disease Father   . Hypercholesterolemia Father   . Thyroid disease Sister   . Bladder Cancer Maternal Grandmother   . Hypertension Neg Hx   . Breast cancer Neg Hx   . Colon cancer Neg Hx     Social History   Socioeconomic History  . Marital status: Married    Spouse name: Not on file  . Number of children: Not on file  . Years of education: Not on file  . Highest education level:  Not on file  Occupational History  . Not on file  Tobacco Use  . Smoking status: Never Smoker  . Smokeless tobacco: Never Used  Vaping Use  . Vaping Use: Never used  Substance and Sexual Activity  . Alcohol use: No    Comment: occas  . Drug use: No  . Sexual activity: Yes    Birth control/protection: None, Surgical, I.U.D.    Comment: Mirena, Tubal ligation  Other Topics Concern  . Not on file  Social History Narrative  . Not on file   Social Determinants of Health   Financial Resource Strain: Not on file  Food Insecurity: Not on file  Transportation Needs: Not on file  Physical Activity: Not on file  Stress: Not on file  Social Connections: Not on file  Intimate Partner Violence: Not on file     Current Outpatient Medications:  .  amLODipine (NORVASC) 10 MG tablet, Take 1 tablet (10 mg total) by mouth daily., Disp: 90 tablet, Rfl: 3 .  levothyroxine (SYNTHROID) 150 MCG tablet, Take 1 tablet (150 mcg total) by mouth daily before breakfast., Disp: 90 tablet, Rfl: 3 .  Multiple Vitamin (MULTIVITAMIN WITH MINERALS) TABS tablet, Take 1 tablet by mouth daily. , Disp: , Rfl:  .  levonorgestrel (MIRENA, 52 MG,) 20 MCG/24HR IUD, 1 Intra Uterine Device (  1 each total) by Intrauterine route once for 1 dose., Disp: 1 Intra Uterine Device, Rfl: 0     ROS:  Review of Systems  Constitutional: Negative for fatigue, fever and unexpected weight change.  Respiratory: Negative for cough, shortness of breath and wheezing.   Cardiovascular: Negative for chest pain, palpitations and leg swelling.  Gastrointestinal: Negative for blood in stool, constipation, diarrhea, nausea and vomiting.  Endocrine: Negative for cold intolerance, heat intolerance and polyuria.  Genitourinary: Positive for frequency. Negative for dyspareunia, dysuria, flank pain, genital sores, hematuria, menstrual problem, pelvic pain, urgency, vaginal bleeding, vaginal discharge and vaginal pain.  Musculoskeletal:  Negative for back pain, joint swelling and myalgias.  Skin: Negative for rash.  Neurological: Negative for dizziness, syncope, light-headedness, numbness and headaches.  Hematological: Negative for adenopathy.  Psychiatric/Behavioral: Negative for agitation, confusion, sleep disturbance and suicidal ideas. The patient is not nervous/anxious.   BREAST: No symptoms   Objective: BP 112/84   Ht 5\' 4"  (1.626 m)   Wt 184 lb (83.5 kg)   BMI 31.58 kg/m    Physical Exam Constitutional:      Appearance: She is well-developed.  Genitourinary:     Vulva normal.     Right Labia: No rash, tenderness or lesions.    Left Labia: No tenderness, lesions or rash.    No vaginal discharge, erythema or tenderness.      Right Adnexa: not tender and no mass present.    Left Adnexa: not tender and no mass present.    No cervical friability or polyp.     IUD strings visualized.     Uterus is not enlarged or tender.  Breasts:     Right: No mass, nipple discharge, skin change or tenderness.     Left: No mass, nipple discharge, skin change or tenderness.    Neck:     Thyroid: No thyromegaly.  Cardiovascular:     Rate and Rhythm: Normal rate and regular rhythm.     Heart sounds: Normal heart sounds. No murmur heard.   Pulmonary:     Effort: Pulmonary effort is normal.     Breath sounds: Normal breath sounds.  Abdominal:     Palpations: Abdomen is soft.     Tenderness: There is no abdominal tenderness. There is no guarding or rebound.  Musculoskeletal:        General: Normal range of motion.     Cervical back: Normal range of motion.  Lymphadenopathy:     Cervical: No cervical adenopathy.  Neurological:     General: No focal deficit present.     Mental Status: She is alert and oriented to person, place, and time.     Cranial Nerves: No cranial nerve deficit.  Skin:    General: Skin is warm and dry.  Psychiatric:        Mood and Affect: Mood normal.        Behavior: Behavior normal.         Thought Content: Thought content normal.        Judgment: Judgment normal.  Vitals reviewed.     Assessment/Plan: Encounter for annual routine gynecological examination  Encounter for routine checking of intrauterine contraceptive device (IUD)--due for removal 8/28.   Menorrhagia with regular cycle--improved with IUD. F/u prn.             GYN counsel adequate intake of calcium and vitamin D, diet and exercise     F/U  Return in about 1 year (around 11/17/2021).  11/19/2021  B. Klarissa Mcilvain, PA-C 11/17/2020 8:38 AM

## 2020-11-16 NOTE — Patient Instructions (Signed)
I value your feedback and you entrusting us with your care. If you get a [facility] patient survey, I would appreciate you taking the time to let us know about your experience today. Thank you! ? ? ?

## 2020-11-17 ENCOUNTER — Ambulatory Visit (INDEPENDENT_AMBULATORY_CARE_PROVIDER_SITE_OTHER): Payer: 59 | Admitting: Obstetrics and Gynecology

## 2020-11-17 ENCOUNTER — Other Ambulatory Visit: Payer: Self-pay

## 2020-11-17 ENCOUNTER — Encounter: Payer: Self-pay | Admitting: Obstetrics and Gynecology

## 2020-11-17 VITALS — BP 112/84 | Ht 64.0 in | Wt 184.0 lb

## 2020-11-17 DIAGNOSIS — N920 Excessive and frequent menstruation with regular cycle: Secondary | ICD-10-CM | POA: Diagnosis not present

## 2020-11-17 DIAGNOSIS — Z30431 Encounter for routine checking of intrauterine contraceptive device: Secondary | ICD-10-CM | POA: Diagnosis not present

## 2020-11-17 DIAGNOSIS — Z01419 Encounter for gynecological examination (general) (routine) without abnormal findings: Secondary | ICD-10-CM | POA: Diagnosis not present

## 2020-11-23 ENCOUNTER — Encounter: Payer: Self-pay | Admitting: Obstetrics and Gynecology

## 2021-05-30 ENCOUNTER — Encounter: Payer: Self-pay | Admitting: Family Medicine

## 2021-05-30 DIAGNOSIS — Z862 Personal history of diseases of the blood and blood-forming organs and certain disorders involving the immune mechanism: Secondary | ICD-10-CM

## 2021-05-30 DIAGNOSIS — M19039 Primary osteoarthritis, unspecified wrist: Secondary | ICD-10-CM

## 2021-05-30 DIAGNOSIS — M19049 Primary osteoarthritis, unspecified hand: Secondary | ICD-10-CM

## 2021-05-31 ENCOUNTER — Other Ambulatory Visit: Payer: 59

## 2021-05-31 ENCOUNTER — Other Ambulatory Visit: Payer: Self-pay

## 2021-05-31 DIAGNOSIS — M19049 Primary osteoarthritis, unspecified hand: Secondary | ICD-10-CM

## 2021-05-31 DIAGNOSIS — Z862 Personal history of diseases of the blood and blood-forming organs and certain disorders involving the immune mechanism: Secondary | ICD-10-CM

## 2021-06-02 ENCOUNTER — Other Ambulatory Visit: Payer: Self-pay

## 2021-06-02 LAB — RHEUMATOID FACTOR: Rheumatoid fact SerPl-aCnc: <14 IU/mL (ref <14)

## 2021-06-02 LAB — ANA: Anti Nuclear Antibody (ANA): NEGATIVE

## 2021-06-02 LAB — SEDIMENTATION RATE: Sed Rate: 11 mm/h (ref 0–20)

## 2021-06-02 LAB — CYCLIC CITRUL PEPTIDE ANTIBODY, IGG: Cyclic Citrullin Peptide Ab: <16 UNITS

## 2021-06-02 LAB — C-REACTIVE PROTEIN: CRP: 7.6 mg/L (ref <8.0)

## 2021-06-02 LAB — ANTI-DNA ANTIBODY, DOUBLE-STRANDED: ds DNA Ab: 1 IU/mL

## 2021-06-06 ENCOUNTER — Encounter: Payer: Self-pay | Admitting: Family Medicine

## 2021-06-08 ENCOUNTER — Encounter: Payer: Self-pay | Admitting: Family Medicine

## 2021-07-18 NOTE — Telephone Encounter (Signed)
Mirena rcvd/charged 03/22/2020

## 2021-08-02 ENCOUNTER — Other Ambulatory Visit: Payer: Self-pay

## 2021-08-02 DIAGNOSIS — E063 Autoimmune thyroiditis: Secondary | ICD-10-CM

## 2021-08-02 MED ORDER — LEVOTHYROXINE SODIUM 150 MCG PO TABS: 150.0000 ug | ORAL_TABLET | Freq: Every day | ORAL | 3 refills | Status: DC

## 2021-10-17 ENCOUNTER — Encounter: Payer: Self-pay | Admitting: Family Medicine

## 2021-10-18 ENCOUNTER — Ambulatory Visit: Payer: Self-pay | Admitting: Family Medicine

## 2021-10-18 ENCOUNTER — Encounter: Payer: Self-pay | Admitting: Family Medicine

## 2021-10-18 ENCOUNTER — Other Ambulatory Visit: Payer: Self-pay

## 2021-10-18 VITALS — BP 139/85 | HR 76 | Ht 64.0 in | Wt 188.6 lb

## 2021-10-18 DIAGNOSIS — M79643 Pain in unspecified hand: Secondary | ICD-10-CM

## 2021-10-18 DIAGNOSIS — M659 Synovitis and tenosynovitis, unspecified: Secondary | ICD-10-CM

## 2021-10-18 DIAGNOSIS — M7989 Other specified soft tissue disorders: Secondary | ICD-10-CM

## 2021-10-18 NOTE — Patient Instructions (Addendum)
Thank you for coming to the office today. ? ?We'll reach out to Dr Serita Grit office to check if they are comfortable skipping the Dermatology eval, since I am unconvinced that it is involving skin with psoriasis. ? ?Go ahead and check cost of Dermatology. ? ?We can review the plan later this week or next week and make a decision ? ?START anti inflammatory topical - OTC Voltaren (generic Diclofenac) topical 2-4 times a day as needed for pain swelling of affected joint for 1-2 weeks or longer. ? ?Recommend to start taking Tylenol Extra Strength 500mg  tabs - take 1 to 2 tabs per dose (max 1000mg ) every 6-8 hours for pain (take regularly, don't skip a dose for next 7 days), max 24 hour daily dose is 6 tablets or 3000mg . In the future you can repeat the same everyday Tylenol course for 1-2 weeks at a time.  ? ?- This is safe to take with anti-inflammatory medicines (Ibuprofen, Advil, Naproxen, Aleve, Meloxicam, Mobic) ? ?Ibuprofen 600mg  with food 3 times a day. ? ? ?Please schedule a Follow-up Appointment to: Return if symptoms worsen or fail to improve. ? ?If you have any other questions or concerns, please feel free to call the office or send a message through [facility]. You may also schedule an earlier appointment if necessary. ? ?Additionally, you may be receiving a survey about your experience at our office within a few days to 1 week by e-mail or mail. We value your feedback. ? ?Nobie Putnam, DO ?[facility] ?

## 2021-10-18 NOTE — Progress Notes (Signed)
? ?Subjective:  ? ? Patient ID: Jacqueline Walsh, female    DOB: 07-31-1981, 41 y.o.   MRN: [id number] ? ?Jacqueline Walsh is a 41 y.o. female presenting on 10/18/2021 for Hand Pain ? ? ?HPI ? ?Left Hand Pain ?Index/Middle Finger L hand, pain and stiffness ?Previous reported onset back in Oct 2022. We did autoimmune panel 05/2021, she went back to [facility], they still thought early autoimmune condition, X-rays were done, no injury occurred, has done cortisone injection, limited relief. She was referred to [facility] saw Dr Posey Pronto on 08/07/21. They thought possible psoriatic arthritis with autoimmune condition, question skin involvement on scalp for psoriasis and asked to see Dermatologist, apt upcoming 10/30/21. ? ?Tried Tylenol Arthritis relief temporary, taking limited dose. ? ?She describes flare ups only, with pain 3-4 days unbearable pain, then will calm down for 2-3 days. Past 2 nights unable to sleep. Most of pain is located in L index finger with swelling and pain worse with extension of finger. Mostly localized to MCP region. ? ?Dx Vitiligo history. ? ?No prior uric acid testing. ? ?No injury or trauma. She uses her hands a lot for work, typing and fine motor activities. Now limiting her function. ? ?Depression screen [facility] 2/9 03/14/2020 02/17/2018  ?Decreased Interest 0 0  ?Down, Depressed, Hopeless 0 0  ?PHQ - 2 Score 0 0  ? ? ?Social History  ? ?Tobacco Use  ? Smoking status: Never  ? Smokeless tobacco: Never  ?Vaping Use  ? Vaping Use: Never used  ?Substance Use Topics  ? Alcohol use: No  ?  Comment: occas  ? Drug use: No  ? ? ?Review of Systems ?Per HPI unless specifically indicated above ? ?   ?Objective:  ?  ?BP 139/85   Pulse 76   Ht 5' 4"  (1.626 m)   Wt 188 lb 9.6 oz (85.5 kg)   SpO2 100%   BMI 32.37 kg/m?   ?Wt Readings from Last 3 Encounters:  ?10/18/21 188 lb 9.6 oz (85.5 kg)  ?11/17/20 184 lb (83.5 kg)  ?10/26/20 184 lb 6.4 oz (83.6 kg)  ?  ?Physical Exam ?Vitals and nursing note reviewed.   ?Constitutional:   ?   General: She is not in acute distress. ?   Appearance: Normal appearance. She is well-developed. She is not diaphoretic.  ?   Comments: Well-appearing, comfortable, cooperative  ?HENT:  ?   Head: Normocephalic and atraumatic.  ?Eyes:  ?   General:     ?   Right eye: No discharge.     ?   Left eye: No discharge.  ?   Conjunctiva/sclera: Conjunctivae normal.  ?Cardiovascular:  ?   Rate and Rhythm: Normal rate.  ?Pulmonary:  ?   Effort: Pulmonary effort is normal.  ?Musculoskeletal:  ?   Comments: Left hand index finger with some swelling notable compared to R, localized around MCP and proximal joint. Impacting L middle MCP as well. Pain limited ROM with extension.  ?Skin: ?   General: Skin is warm and dry.  ?   Findings: No erythema or rash.  ?Neurological:  ?   Mental Status: She is alert and oriented to person, place, and time.  ?Psychiatric:     ?   Mood and Affect: Mood normal.     ?   Behavior: Behavior normal.     ?   Thought Content: Thought content normal.  ?   Comments: Well groomed, good eye contact, normal speech and thoughts  ? ? ?  Results for orders placed or performed in visit on 05/31/21  ?Anti-DNA antibody, double-stranded  ?Result Value Ref Range  ? ds DNA Ab 1 IU/mL  ?Cyclic citrul peptide antibody, IgG  ?Result Value Ref Range  ? Cyclic Citrullin Peptide Ab <16 UNITS  ?Rheumatoid Factor  ?Result Value Ref Range  ? Rhuematoid fact SerPl-aCnc <14 <14 IU/mL  ?Antinuclear Antib (ANA)  ?Result Value Ref Range  ? Anti Nuclear Antibody (ANA) NEGATIVE NEGATIVE  ?Sed Rate (ESR)  ?Result Value Ref Range  ? Sed Rate 11 0 - 20 mm/h  ?C-reactive protein  ?Result Value Ref Range  ? CRP 7.6 <8.0 mg/L  ? ?   ?Assessment & Plan:  ? ?Problem List Items Addressed This Visit   ?None ?Visit Diagnoses   ? ? Synovitis of left hand    -  Primary  ? Intermittent pain and swelling of hand      ? Relevant Orders  ? Uric acid  ? ?  ?  ?Clinically with persistent several months now inflammatory joint  pain ?Currently focused on episodic flares Left hand seems most consistent with synovitis with extensor tendons MCP region index/middle ? 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 ?I am not able to identify skin involvement of psoriasis, and she has not reported this condition. Therefore we discussed today possibility returning to Rheumatology for further trial on management for the joint component only. She may consider keeping apt [facility] Dr Nicole Kindred on 3/13 if she can - but w/o insurance coverage may be costly and we will try to reconnect with [facility] Rhe[facility] about returning to them instead. ? ?No orders of the defined types were placed in this encounter. ? ? ? ? ?Follow up plan: ?Return if symptoms worsen or fail to improve. ? ?Will place a call Dr Serita Grit office see if they are okay skipping Derm ? ?Nobie Putnam, DO ?[facility] ?[facility] ?10/18/2021, 9:41 AM ?

## 2021-10-19 LAB — URIC ACID: Uric Acid, Serum: 5 mg/dL (ref 2.5–7.0)

## 2021-10-30 ENCOUNTER — Ambulatory Visit: Payer: Self-pay | Admitting: Dermatology

## 2021-11-28 DIAGNOSIS — L408 Other psoriasis: Secondary | ICD-10-CM | POA: Diagnosis not present

## 2021-11-28 DIAGNOSIS — L4050 Arthropathic psoriasis, unspecified: Secondary | ICD-10-CM | POA: Diagnosis not present

## 2021-12-04 DIAGNOSIS — L409 Psoriasis, unspecified: Secondary | ICD-10-CM | POA: Diagnosis not present

## 2021-12-04 DIAGNOSIS — Z7960 Long term (current) use of unspecified immunomodulators and immunosuppressants: Secondary | ICD-10-CM | POA: Diagnosis not present

## 2021-12-04 DIAGNOSIS — L4050 Arthropathic psoriasis, unspecified: Secondary | ICD-10-CM | POA: Insufficient documentation

## 2021-12-04 DIAGNOSIS — Z111 Encounter for screening for respiratory tuberculosis: Secondary | ICD-10-CM | POA: Diagnosis not present

## 2021-12-28 NOTE — Progress Notes (Signed)
? ?PCP:  Smitty Cords, DO ? ? ?Chief Complaint  ?Patient presents with  ? Gynecologic Exam  ?  No concerns  ? ? ? ?HPI: ?     Jacqueline Walsh is a 41 y.o. No obstetric history on file. who LMP was No LMP recorded. (Menstrual status: IUD)., presents today for her annual examination.  Her menses are now random, light spotting for a few days, no dysmen. Hx of menorrhagia; periods were 6-8 days of heavy flow, with 1/2 dollar sized clots last yr before IUD.   ? ?Sex activity: single partner, contraception - tubal ligation/Mirena; no pain /bleeding ?Last Pap: 11/11/19 with MDL; Results were: no abnormalities /neg HPV DNA. No hx of abn paps.  ? ?There is no FH of breast cancer. There is no FH of ovarian cancer. The patient does do self-breast exams. Pt with implants; had CT scan that showed rupture implant. Pt's plastic surgeon has since deceased and she has not had f/u.   ? ?Tobacco use: The patient denies current or previous tobacco use. ?Alcohol use: occas ?No drug use.  ?Exercise: moderately active ? ?She does get adequate calcium and Vitamin D in her diet. ?Labs with PCP ? ?Past Medical History:  ?Diagnosis Date  ? Hypertension   ? Hypothyroidism   ? Migraine, unspecified, without mention of intractable migraine without mention of status migrainosus   ? Thyroid disease   ? ? ?Past Surgical History:  ?Procedure Laterality Date  ? LAPAROSCOPIC APPENDECTOMY N/A 01/07/2018  ? Procedure: APPENDECTOMY LAPAROSCOPIC;  Surgeon: Leafy Ro, MD;  Location: [facility] ORS;  Service: General;  Laterality: N/A;  ? TUBAL LIGATION    ? ? ?Family History  ?Problem Relation Age of Onset  ? Hyperlipidemia Mother   ? Transient ischemic attack Mother   ? Thyroid disease Father   ? Hypercholesterolemia Father   ? Thyroid disease Sister   ? Bladder Cancer Maternal Grandmother   ? Hypertension Neg Hx   ? Breast cancer Neg Hx   ? Colon cancer Neg Hx   ? ? ?Social History  ? ?Socioeconomic History  ? Marital status: Married  ?   Spouse name: Not on file  ? Number of children: Not on file  ? Years of education: Not on file  ? Highest education level: Not on file  ?Occupational History  ? Not on file  ?Tobacco Use  ? Smoking status: Never  ? Smokeless tobacco: Never  ?Vaping Use  ? Vaping Use: Never used  ?Substance and Sexual Activity  ? Alcohol use: No  ?  Comment: occas  ? Drug use: No  ? Sexual activity: Yes  ?  Birth control/protection: None, Surgical, I.U.D.  ?  Comment: Mirena, Tubal ligation  ?Other Topics Concern  ? Not on file  ?Social History Narrative  ? Not on file  ? ?Social Determinants of Health  ? ?Financial Resource Strain: Not on file  ?Food Insecurity: Not on file  ?Transportation Needs: Not on file  ?Physical Activity: Not on file  ?Stress: Not on file  ?Social Connections: Not on file  ?Intimate Partner Violence: Not on file  ? ? ? ?Current Outpatient Medications:  ?  amLODipine (NORVASC) 10 MG tablet, Take 1 tablet (10 mg total) by mouth daily., Disp: 90 tablet, Rfl: 3 ?  levothyroxine (SYNTHROID) 150 MCG tablet, Take 1 tablet (150 mcg total) by mouth daily before breakfast., Disp: 90 tablet, Rfl: 3 ?  Multiple Vitamin (MULTIVITAMIN WITH MINERALS) TABS tablet, Take  1 tablet by mouth daily. , Disp: , Rfl:  ?  levonorgestrel (MIRENA, 52 MG,) 20 MCG/24HR IUD, 1 Intra Uterine Device (1 each total) by Intrauterine route once for 1 dose., Disp: 1 Intra Uterine Device, Rfl: 0 ? ? ? ? ?ROS: ? ?Review of Systems  ?Constitutional:  Negative for fatigue, fever and unexpected weight change.  ?Respiratory:  Negative for cough, shortness of breath and wheezing.   ?Cardiovascular:  Negative for chest pain, palpitations and leg swelling.  ?Gastrointestinal:  Negative for blood in stool, constipation, diarrhea, nausea and vomiting.  ?Endocrine: Negative for cold intolerance, heat intolerance and polyuria.  ?Genitourinary:  Negative for dyspareunia, dysuria, flank pain, frequency, genital sores, hematuria, menstrual problem, pelvic pain,  urgency, vaginal bleeding, vaginal discharge and vaginal pain.  ?Musculoskeletal:  Negative for back pain, joint swelling and myalgias.  ?Skin:  Negative for rash.  ?Neurological:  Negative for dizziness, syncope, light-headedness, numbness and headaches.  ?Hematological:  Negative for adenopathy.  ?Psychiatric/Behavioral:  Negative for agitation, confusion, sleep disturbance and suicidal ideas. The patient is not nervous/anxious.  BREAST: No symptoms ? ? ?Objective: ?BP 122/80   Ht 5\' 3"  (1.6 m)   Wt 184 lb (83.5 kg)   BMI 32.59 kg/m?  ? ? ?Physical Exam ?Constitutional:   ?   Appearance: She is well-developed.  ?Genitourinary:  ?   Vulva normal.  ?   Right Labia: No rash, tenderness or lesions. ?   Left Labia: No tenderness, lesions or rash. ?   No vaginal discharge, erythema or tenderness.  ? ?   Right Adnexa: not tender and no mass present. ?   Left Adnexa: not tender and no mass present. ?   No cervical friability or polyp.  ?   IUD strings visualized.  ?   Uterus is not enlarged or tender.  ?Breasts: ?   Right: No mass, nipple discharge, skin change or tenderness.  ?   Left: No mass, nipple discharge, skin change or tenderness.  ?Neck:  ?   Thyroid: No thyromegaly.  ?Cardiovascular:  ?   Rate and Rhythm: Normal rate and regular rhythm.  ?   Heart sounds: Normal heart sounds. No murmur heard. ?Pulmonary:  ?   Effort: Pulmonary effort is normal.  ?   Breath sounds: Normal breath sounds.  ?Abdominal:  ?   Palpations: Abdomen is soft.  ?   Tenderness: There is no abdominal tenderness. There is no guarding or rebound.  ?Musculoskeletal:     ?   General: Normal range of motion.  ?   Cervical back: Normal range of motion.  ?Lymphadenopathy:  ?   Cervical: No cervical adenopathy.  ?Neurological:  ?   General: No focal deficit present.  ?   Mental Status: She is alert and oriented to person, place, and time.  ?   Cranial Nerves: No cranial nerve deficit.  ?Skin: ?   General: Skin is warm and dry.  ?Psychiatric:      ?   Mood and Affect: Mood normal.     ?   Behavior: Behavior normal.     ?   Thought Content: Thought content normal.     ?   Judgment: Judgment normal.  ?Vitals reviewed.  ? ? ?Assessment/Plan: ?Encounter for annual routine gynecological examination ? ?Encounter for routine checking of intrauterine contraceptive device (IUD)--IUD strings in cx os; has 8 yr indication ? ?Menorrhagia with regular cycle--sx resolved with IUD ? ?Rupture of implant of left breast, initial encounter--pt to f/u with  plastic surgeon; recommended Dr. Izora Ribasoley in [place] ?          ?GYN counsel adequate intake of calcium and vitamin D, diet and exercise ? ? ?  F/U ? Return in about 1 year (around 01/03/2023). ? ?Hasaan Radde B. Tanielle Emigh, PA-C ?01/02/2022 ?9:12 AM ?

## 2021-12-29 IMAGING — CR DG CHEST 2V
1 series · 3 of 3 positions shown · non-contrast
Comparison: September 19, 2016

CLINICAL DATA: Chest pain

EXAM:
CHEST - 2 VIEW

[Series 1: w chest pa · 0.14mm/px · 3 of 3 slices shown]
[im 1/3]
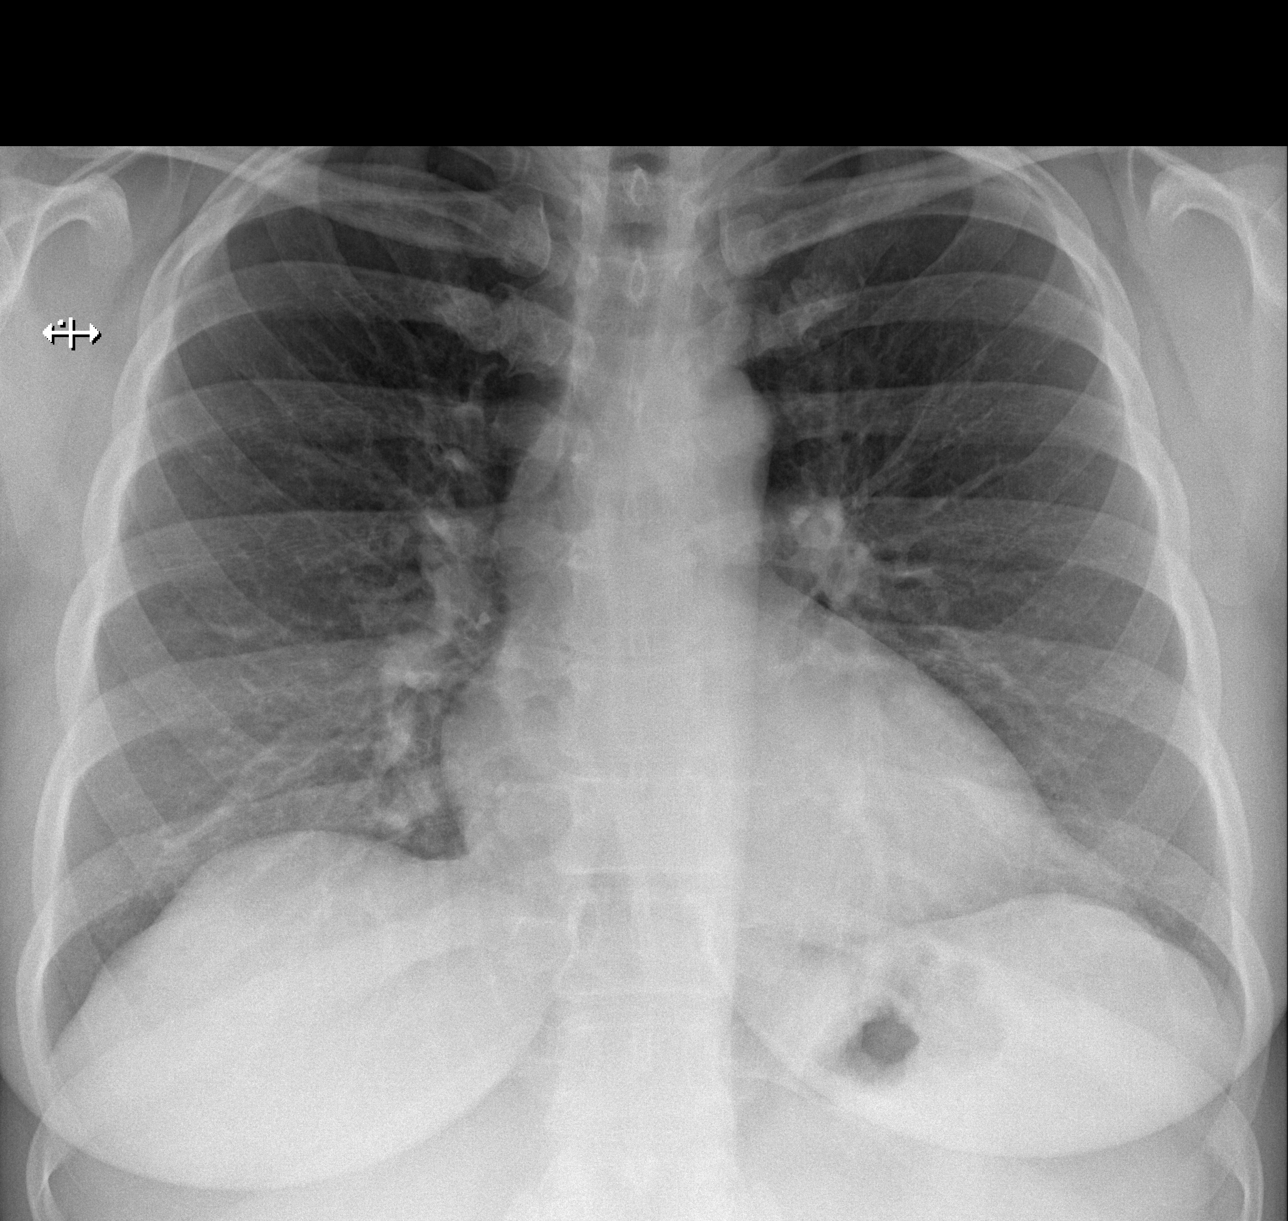
[im 2/3]
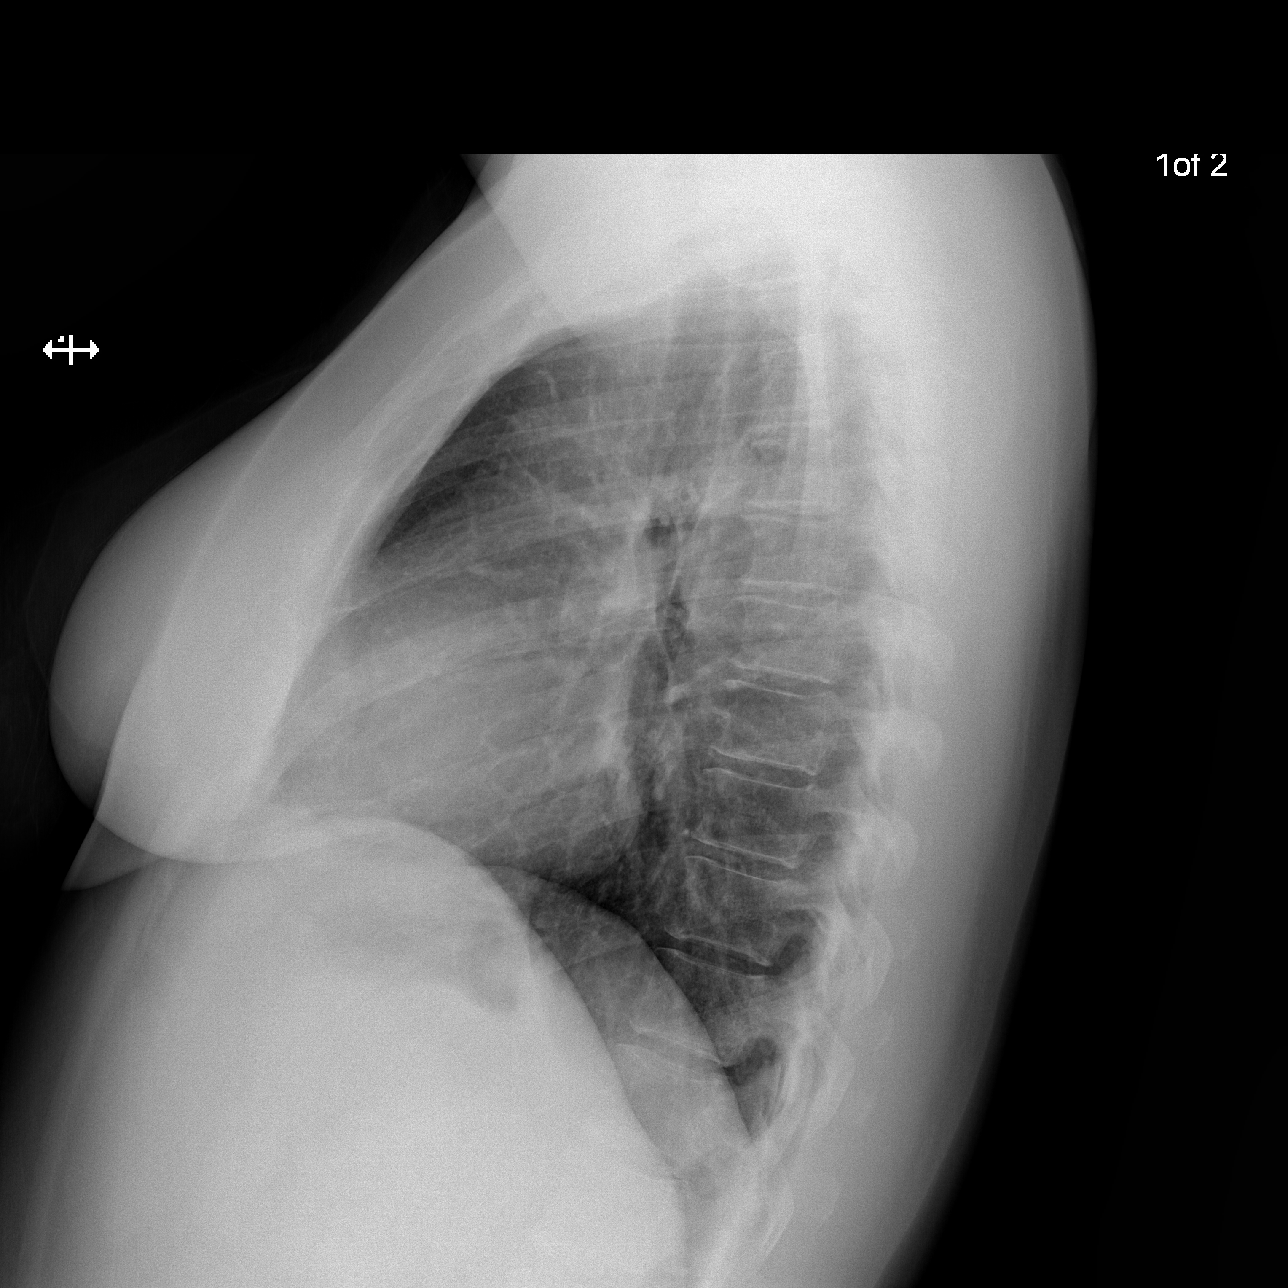
[im 3/3]
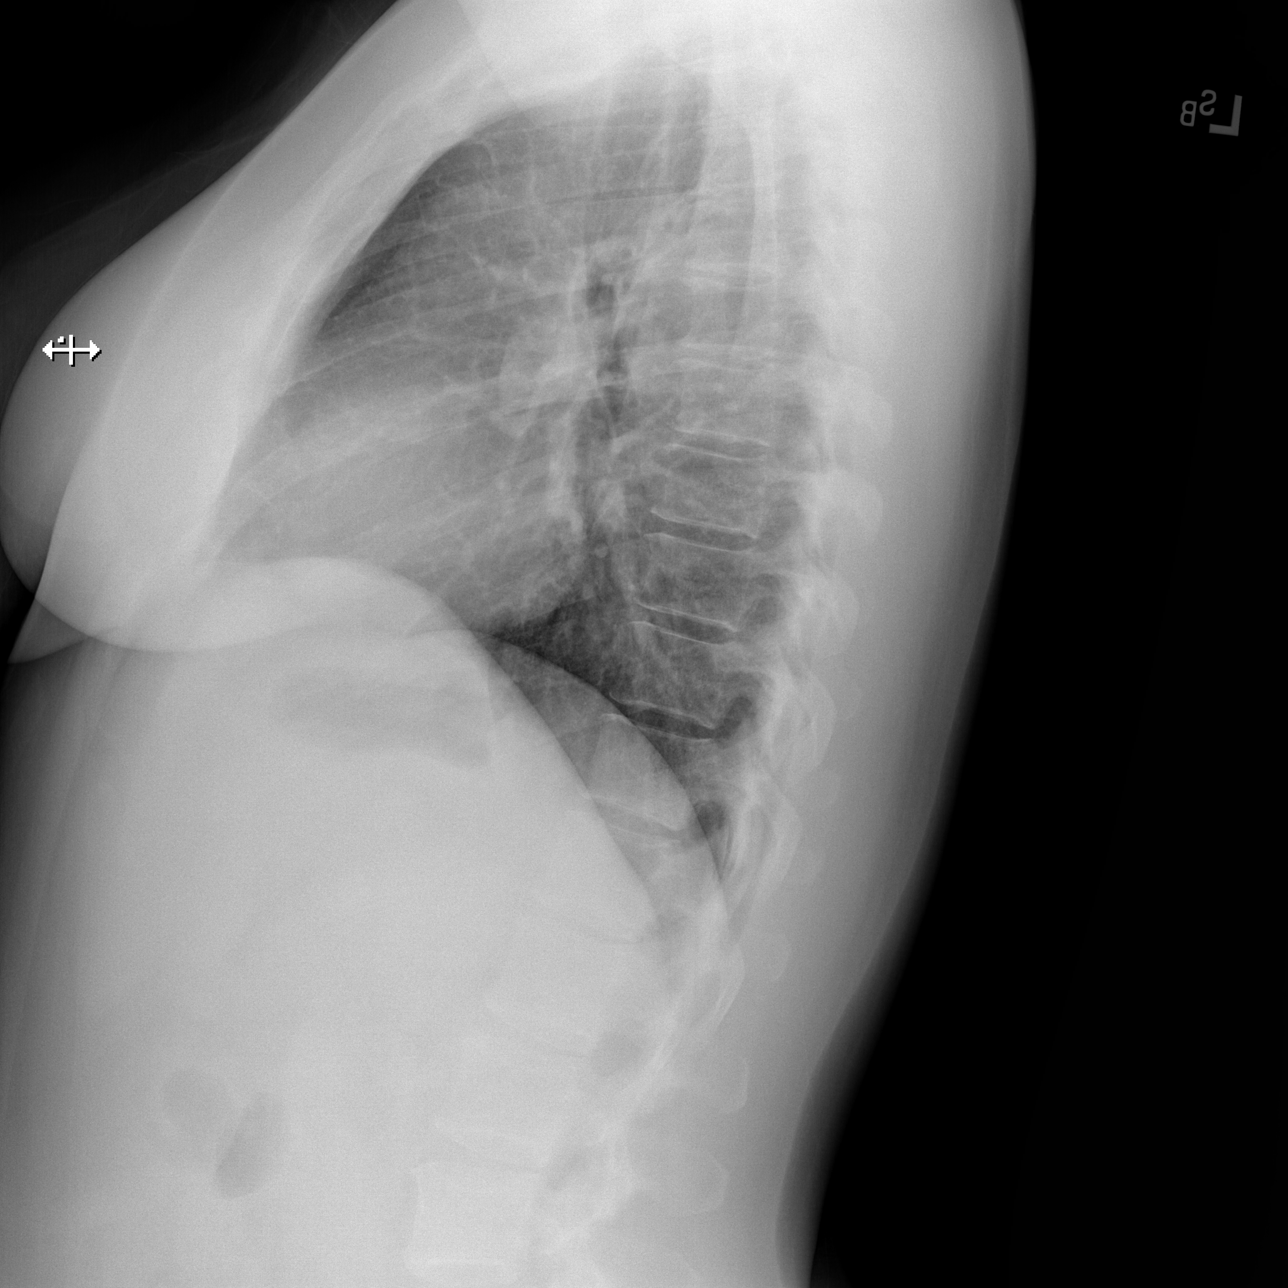

[3 of 3 positions shown; findings below may reference images not displayed]

FINDINGS: Lungs are clear. Heart size and pulmonary vascularity are normal. No
adenopathy. No pneumothorax. No bone lesions.
IMPRESSION: Lungs clear.  Cardiac silhouette within normal limits.

## 2022-01-02 ENCOUNTER — Ambulatory Visit (INDEPENDENT_AMBULATORY_CARE_PROVIDER_SITE_OTHER): Payer: 59 | Admitting: Obstetrics and Gynecology

## 2022-01-02 ENCOUNTER — Encounter: Payer: Self-pay | Admitting: Obstetrics and Gynecology

## 2022-01-02 VITALS — BP 122/80 | Ht 63.0 in | Wt 184.0 lb

## 2022-01-02 DIAGNOSIS — T8543XA Leakage of breast prosthesis and implant, initial encounter: Secondary | ICD-10-CM | POA: Diagnosis not present

## 2022-01-02 DIAGNOSIS — Z01411 Encounter for gynecological examination (general) (routine) with abnormal findings: Secondary | ICD-10-CM | POA: Diagnosis not present

## 2022-01-02 DIAGNOSIS — Z30431 Encounter for routine checking of intrauterine contraceptive device: Secondary | ICD-10-CM

## 2022-01-02 DIAGNOSIS — Z01419 Encounter for gynecological examination (general) (routine) without abnormal findings: Secondary | ICD-10-CM

## 2022-01-02 DIAGNOSIS — N920 Excessive and frequent menstruation with regular cycle: Secondary | ICD-10-CM

## 2022-01-02 NOTE — Patient Instructions (Signed)
I value your feedback and you entrusting us with your care. If you get a [facility] patient survey, I would appreciate you taking the time to let us know about your experience today. Thank you! ? ? ?

## 2022-02-13 ENCOUNTER — Ambulatory Visit (INDEPENDENT_AMBULATORY_CARE_PROVIDER_SITE_OTHER): Payer: 59 | Admitting: Family Medicine

## 2022-02-13 ENCOUNTER — Encounter: Payer: Self-pay | Admitting: Family Medicine

## 2022-02-13 VITALS — BP 141/87 | HR 70 | Ht 63.0 in | Wt 184.8 lb

## 2022-02-13 DIAGNOSIS — E7800 Pure hypercholesterolemia, unspecified: Secondary | ICD-10-CM

## 2022-02-13 DIAGNOSIS — Z0000 Encounter for general adult medical examination without abnormal findings: Secondary | ICD-10-CM | POA: Diagnosis not present

## 2022-02-13 DIAGNOSIS — Z1159 Encounter for screening for other viral diseases: Secondary | ICD-10-CM

## 2022-02-13 DIAGNOSIS — R29818 Other symptoms and signs involving the nervous system: Secondary | ICD-10-CM | POA: Diagnosis not present

## 2022-02-13 DIAGNOSIS — R7309 Other abnormal glucose: Secondary | ICD-10-CM | POA: Diagnosis not present

## 2022-02-13 DIAGNOSIS — E063 Autoimmune thyroiditis: Secondary | ICD-10-CM

## 2022-02-13 DIAGNOSIS — I10 Essential (primary) hypertension: Secondary | ICD-10-CM | POA: Diagnosis not present

## 2022-02-13 MED ORDER — HYDROCHLOROTHIAZIDE 25 MG PO TABS: 25.0000 mg | ORAL_TABLET | Freq: Every day | ORAL | 3 refills | Status: DC

## 2022-02-13 NOTE — Assessment & Plan Note (Signed)
Chronic hashimoto's hypothyroidism Well controlled in past Previously Managed by ENT Dr Jacqueline Walsh - now retired  Last lab showed normal Thyroid panel + TSH 10/2020 Has Refill levothyroxine daily  Will add labs today for TSH T4 T3

## 2022-02-14 LAB — LIPID PANEL
Cholesterol: 196 mg/dL (ref <200)
HDL: 42 mg/dL — ABNORMAL LOW (ref >=50)
LDL Cholesterol (Calc): 122 mg/dL (calc) — ABNORMAL HIGH
Non-HDL Cholesterol (Calc): 154 mg/dL (calc) — ABNORMAL HIGH (ref <130)
Total CHOL/HDL Ratio: 4.7 (calc) (ref <5.0)
Triglycerides: 202 mg/dL — ABNORMAL HIGH (ref <150)

## 2022-02-14 LAB — CBC WITH DIFFERENTIAL/PLATELET
Absolute Monocytes: 391 cells/uL (ref 200–950)
Basophils Absolute: 43 cells/uL (ref 0–200)
Basophils Relative: 0.6 %
Eosinophils Absolute: 128 cells/uL (ref 15–500)
Eosinophils Relative: 1.8 %
HCT: 42.1 % (ref 35.0–45.0)
Hemoglobin: 14.2 g/dL (ref 11.7–15.5)
Lymphs Abs: 2038 cells/uL (ref 850–3900)
MCH: 30.3 pg (ref 27.0–33.0)
MCHC: 33.7 g/dL (ref 32.0–36.0)
MCV: 90 fL (ref 80.0–100.0)
MPV: 10.8 fL (ref 7.5–12.5)
Monocytes Relative: 5.5 %
Neutro Abs: 4501 cells/uL (ref 1500–7800)
Neutrophils Relative %: 63.4 %
Platelets: 302 10*3/uL (ref 140–400)
RBC: 4.68 10*6/uL (ref 3.80–5.10)
RDW: 12.8 % (ref 11.0–15.0)
Total Lymphocyte: 28.7 %
WBC: 7.1 10*3/uL (ref 3.8–10.8)

## 2022-02-14 LAB — HEMOGLOBIN A1C
Hgb A1c MFr Bld: 5 % of total Hgb (ref <5.7)
Mean Plasma Glucose: 97 mg/dL
eAG (mmol/L): 5.4 mmol/L

## 2022-02-14 LAB — COMPLETE METABOLIC PANEL WITH GFR
AG Ratio: 1.7 (calc) (ref 1.0–2.5)
ALT: 22 U/L (ref 6–29)
AST: 21 U/L (ref 10–30)
Albumin: 4.7 g/dL (ref 3.6–5.1)
Alkaline phosphatase (APISO): 78 U/L (ref 31–125)
BUN: 10 mg/dL (ref 7–25)
CO2: 23 mmol/L (ref 20–32)
Calcium: 9.2 mg/dL (ref 8.6–10.2)
Chloride: 104 mmol/L (ref 98–110)
Creat: 0.7 mg/dL (ref 0.50–0.97)
Globulin: 2.7 g/dL (calc) (ref 1.9–3.7)
Glucose, Bld: 90 mg/dL (ref 65–99)
Potassium: 3.8 mmol/L (ref 3.5–5.3)
Sodium: 139 mmol/L (ref 135–146)
Total Bilirubin: 0.6 mg/dL (ref 0.2–1.2)
Total Protein: 7.4 g/dL (ref 6.1–8.1)
eGFR: 113 mL/min/{1.73_m2} (ref >=60)

## 2022-02-14 LAB — T4, FREE: Free T4: 1.1 ng/dL (ref 0.8–1.8)

## 2022-02-14 LAB — T3, FREE: T3, Free: 2.8 pg/mL (ref 2.3–4.2)

## 2022-02-14 LAB — TSH: TSH: 5.3 mIU/L — ABNORMAL HIGH

## 2022-02-14 LAB — HEPATITIS C ANTIBODY: Hepatitis C Ab: NONREACTIVE

## 2022-05-21 ENCOUNTER — Other Ambulatory Visit: Payer: Self-pay | Admitting: Plastic Surgery

## 2022-05-21 DIAGNOSIS — T8543XA Leakage of breast prosthesis and implant, initial encounter: Secondary | ICD-10-CM

## 2022-06-04 ENCOUNTER — Ambulatory Visit (INDEPENDENT_AMBULATORY_CARE_PROVIDER_SITE_OTHER): Payer: 59 | Admitting: Family Medicine

## 2022-06-04 ENCOUNTER — Encounter: Payer: Self-pay | Admitting: Family Medicine

## 2022-06-04 ENCOUNTER — Other Ambulatory Visit: Payer: Self-pay | Admitting: Family Medicine

## 2022-06-04 VITALS — BP 170/110 | HR 85

## 2022-06-04 DIAGNOSIS — I160 Hypertensive urgency: Secondary | ICD-10-CM | POA: Diagnosis not present

## 2022-06-04 DIAGNOSIS — E063 Autoimmune thyroiditis: Secondary | ICD-10-CM

## 2022-06-04 DIAGNOSIS — E876 Hypokalemia: Secondary | ICD-10-CM

## 2022-06-04 LAB — CBC WITH DIFFERENTIAL/PLATELET
Absolute Monocytes: 431 cells/uL (ref 200–950)
Basophils Absolute: 53 cells/uL (ref 0–200)
Basophils Relative: 0.6 %
Eosinophils Absolute: 123 cells/uL (ref 15–500)
Eosinophils Relative: 1.4 %
HCT: 41.1 % (ref 35.0–45.0)
Hemoglobin: 14.3 g/dL (ref 11.7–15.5)
Lymphs Abs: 2015 cells/uL (ref 850–3900)
MCH: 30.7 pg (ref 27.0–33.0)
MCHC: 34.8 g/dL (ref 32.0–36.0)
MCV: 88.2 fL (ref 80.0–100.0)
MPV: 11 fL (ref 7.5–12.5)
Monocytes Relative: 4.9 %
Neutro Abs: 6178 cells/uL (ref 1500–7800)
Neutrophils Relative %: 70.2 %
Platelets: 293 10*3/uL (ref 140–400)
RBC: 4.66 10*6/uL (ref 3.80–5.10)
RDW: 13.1 % (ref 11.0–15.0)
Total Lymphocyte: 22.9 %
WBC: 8.8 10*3/uL (ref 3.8–10.8)

## 2022-06-04 LAB — TROPONIN I: Troponin I: 7 ng/L (ref <=47)

## 2022-06-04 LAB — COMPLETE METABOLIC PANEL WITH GFR
AG Ratio: 1.5 (calc) (ref 1.0–2.5)
ALT: 19 U/L (ref 6–29)
AST: 25 U/L (ref 10–30)
Albumin: 4.6 g/dL (ref 3.6–5.1)
Alkaline phosphatase (APISO): 65 U/L (ref 31–125)
BUN: 11 mg/dL (ref 7–25)
CO2: 26 mmol/L (ref 20–32)
Calcium: 9.4 mg/dL (ref 8.6–10.2)
Chloride: 98 mmol/L (ref 98–110)
Creat: 0.73 mg/dL (ref 0.50–0.97)
Globulin: 3.1 g/dL (calc) (ref 1.9–3.7)
Glucose, Bld: 96 mg/dL (ref 65–99)
Potassium: 3.3 mmol/L — ABNORMAL LOW (ref 3.5–5.3)
Sodium: 137 mmol/L (ref 135–146)
Total Bilirubin: 0.7 mg/dL (ref 0.2–1.2)
Total Protein: 7.7 g/dL (ref 6.1–8.1)
eGFR: 107 mL/min/{1.73_m2} (ref >=60)

## 2022-06-04 MED ORDER — VALSARTAN 80 MG PO TABS: 80.0000 mg | ORAL_TABLET | Freq: Every day | ORAL | 2 refills | Status: DC

## 2022-06-04 MED ORDER — POTASSIUM CHLORIDE CRYS ER 10 MEQ PO TBCR: 10.0000 meq | EXTENDED_RELEASE_TABLET | Freq: Every day | ORAL | 0 refills | Status: DC

## 2022-06-04 MED ORDER — HYDRALAZINE HCL 10 MG PO TABS: 10.0000 mg | ORAL_TABLET | Freq: Three times a day (TID) | ORAL | 0 refills | Status: DC | PRN

## 2022-06-04 NOTE — Progress Notes (Signed)
Subjective:    Patient ID: Jacqueline Walsh, female    DOB: 01-May-1981, 41 y.o.   MRN: [id number]  Jacqueline Walsh is a 40 y.o. female presenting on 06/04/2022 for Hypertension  Patient presents for a same day appointment.  HPI  Hypertensive Urgency Acute worsening BP recently. Here today with High BP at home and in office. Friday through weekend high blood pressure, and it seemed to cause her to now feel well. Not endorsing significant headache. Home readings 184/113, peak reading Saturday 210/118 She admits some indigestion chest pressure but not localized, no chest pain.  Fam history grandmother with heart attack No headache Denies nausea vomiting sweating, loss of conscious, dizziness lightheadedness, abdominal pain       02/13/2022   10:07 AM 03/14/2020    3:51 PM 02/17/2018   10:19 AM  Depression screen PHQ 2/9  Decreased Interest 0 0 0  Down, Depressed, Hopeless 0 0 0  PHQ - 2 Score 0 0 0  Altered sleeping 0    Tired, decreased energy 2    Change in appetite 0    Feeling bad or failure about yourself  0    Trouble concentrating 0    Moving slowly or fidgety/restless 0    Suicidal thoughts 0    PHQ-9 Score 2    Difficult doing work/chores Not difficult at all      Social History   Tobacco Use   Smoking status: Never   Smokeless tobacco: Never  Vaping Use   Vaping Use: Never used  Substance Use Topics   Alcohol use: No    Comment: occas   Drug use: No    Review of Systems Per HPI unless specifically indicated above     Objective:    BP (!) 170/110 (BP Location: Left Arm, Cuff Size: Normal)   Pulse 85   SpO2 99%   Wt Readings from Last 3 Encounters:  02/13/22 184 lb 12.8 oz (83.8 kg)  01/02/22 184 lb (83.5 kg)  10/18/21 188 lb 9.6 oz (85.5 kg)    Physical Exam Vitals and nursing note reviewed.  Constitutional:      General: She is not in acute distress.    Appearance: She is well-developed. She is not diaphoretic.     Comments: Well-appearing,  comfortable, cooperative  HENT:     Head: Normocephalic and atraumatic.  Eyes:     General:        Right eye: No discharge.        Left eye: No discharge.     Conjunctiva/sclera: Conjunctivae normal.  Neck:     Thyroid: No thyromegaly.  Cardiovascular:     Rate and Rhythm: Normal rate and regular rhythm.     Heart sounds: Normal heart sounds. No murmur heard. Pulmonary:     Effort: Pulmonary effort is normal. No respiratory distress.     Breath sounds: Normal breath sounds. No wheezing or rales.  Musculoskeletal:        General: Normal range of motion.     Cervical back: Normal range of motion and neck supple.  Lymphadenopathy:     Cervical: No cervical adenopathy.  Skin:    General: Skin is warm and dry.     Findings: No erythema or rash.  Neurological:     Mental Status: She is alert and oriented to person, place, and time.  Psychiatric:        Behavior: Behavior normal.     Comments: Well groomed, good eye contact,  normal speech and thoughts    EKG - performed in office today  Date: 06/04/22  Rate: 84   Rhythm: normal sinus rhythm  QRS Axis: normal  Intervals: normal  ST/T Wave abnormalities: nonspecific ST changes  Conduction Disutrbances:none  Additional Narrative Interpretation: None  Old EKG Reviewed: unchanged  11/26/19      Results for orders placed or performed in visit on 02/13/22  COMPLETE METABOLIC PANEL WITH GFR  Result Value Ref Range   Glucose, Bld 90 65 - 99 mg/dL   BUN 10 7 - 25 mg/dL   Creat 0.70 0.50 - 0.97 mg/dL   eGFR 113 > OR = 60 mL/min/1.83m   BUN/Creatinine Ratio NOT APPLICABLE 6 - 22 (calc)   Sodium 139 135 - 146 mmol/L   Potassium 3.8 3.5 - 5.3 mmol/L   Chloride 104 98 - 110 mmol/L   CO2 23 20 - 32 mmol/L   Calcium 9.2 8.6 - 10.2 mg/dL   Total Protein 7.4 6.1 - 8.1 g/dL   Albumin 4.7 3.6 - 5.1 g/dL   Globulin 2.7 1.9 - 3.7 g/dL (calc)   AG Ratio 1.7 1.0 - 2.5 (calc)   Total Bilirubin 0.6 0.2 - 1.2 mg/dL   Alkaline phosphatase  (APISO) 78 31 - 125 U/L   AST 21 10 - 30 U/L   ALT 22 6 - 29 U/L  CBC with Differential/Platelet  Result Value Ref Range   WBC 7.1 3.8 - 10.8 Thousand/uL   RBC 4.68 3.80 - 5.10 Million/uL   Hemoglobin 14.2 11.7 - 15.5 g/dL   HCT 42.1 35.0 - 45.0 %   MCV 90.0 80.0 - 100.0 fL   MCH 30.3 27.0 - 33.0 pg   MCHC 33.7 32.0 - 36.0 g/dL   RDW 12.8 11.0 - 15.0 %   Platelets 302 140 - 400 Thousand/uL   MPV 10.8 7.5 - 12.5 fL   Neutro Abs 4,501 1,500 - 7,800 cells/uL   Lymphs Abs 2,038 850 - 3,900 cells/uL   Absolute Monocytes 391 200 - 950 cells/uL   Eosinophils Absolute 128 15 - 500 cells/uL   Basophils Absolute 43 0 - 200 cells/uL   Neutrophils Relative % 63.4 %   Total Lymphocyte 28.7 %   Monocytes Relative 5.5 %   Eosinophils Relative 1.8 %   Basophils Relative 0.6 %  Lipid panel  Result Value Ref Range   Cholesterol 196 <200 mg/dL   HDL 42 (L) > OR = 50 mg/dL   Triglycerides 202 (H) <150 mg/dL   LDL Cholesterol (Calc) 122 (H) mg/dL (calc)   Total CHOL/HDL Ratio 4.7 <5.0 (calc)   Non-HDL Cholesterol (Calc) 154 (H) <130 mg/dL (calc)  Hemoglobin A1c  Result Value Ref Range   Hgb A1c MFr Bld 5.0 <5.7 % of total Hgb   Mean Plasma Glucose 97 mg/dL   eAG (mmol/L) 5.4 mmol/L  TSH  Result Value Ref Range   TSH 5.30 (H) mIU/L  T4, free  Result Value Ref Range   Free T4 1.1 0.8 - 1.8 ng/dL  Hepatitis C antibody  Result Value Ref Range   Hepatitis C Ab NON-REACTIVE NON-REACTIVE  T3, free  Result Value Ref Range   T3, Free 2.8 2.3 - 4.2 pg/mL      Assessment & Plan:   Problem List Items Addressed This Visit     Essential hypertension - Primary   Relevant Medications   valsartan (DIOVAN) 80 MG tablet   hydrALAZINE (APRESOLINE) 10 MG tablet   HTN Urgency  Minimal focal symptoms at this time. No evidence of neurological or cardiovascular acute concern except HTN today HR in 80s today No diaphoresis or acute symptoms Patient is comfortable without active chest pain EKG done  today in office Compared to 11/2019 unchanged see report.  No active migraine, has had similar issue before with migraine headaches  She prefers to do outpatient work up if at all possible.  STAT labs ordered today CMET CBC Troponin  Resume HCTZ 19m daily today, she did not take this AM. Only take once not BID now  ADD Valsartan 812mdaily, 2nd agent.  Past history failed Lisinopril10-2070mneffective, and failed Amlodipine 59m51me to edema.  Add 3rd med ONLY PRN - Take the Hydralazine extra BP pill ONLY IF BP >160/100 every 8 hours or 3 times a day max.  If you have any significant chest pain that does not go away within 30 minutes, is accompanied by nausea, sweating, shortness of breath, or made worse by activity, this may be evidence of a heart attack, especially if symptoms worsening instead of improving, please call 911 or go directly to the emergency room immediately for evaluation.     Meds ordered this encounter  Medications   valsartan (DIOVAN) 80 MG tablet    Sig: Take 1 tablet (80 mg total) by mouth daily.    Dispense:  30 tablet    Refill:  2   hydrALAZINE (APRESOLINE) 10 MG tablet    Sig: Take 1 tablet (10 mg total) by mouth 3 (three) times daily as needed (elevated BP >160/100).    Dispense:  90 tablet    Refill:  0      Follow up plan: Return if symptoms worsen or fail to improve.  AlexNobie Putnam [facility] 06/04/2022, 8:36 AM

## 2022-06-04 NOTE — Patient Instructions (Addendum)
Thank you for coming to the office today.  St[facility] tuned for blood  Take previous BP med and new one Valsartan once today, keep each one once daily in morning  Take the Hydralazine extra BP pill ONLY IF BP >160/100 every 8 hours or 3 times a day max.  If you have any significant chest pain that does not go away within 30 minutes, is accompanied by nausea, sweating, shortness of breath, or made worse by activity, this may be evidence of a heart attack, especially if symptoms worsening instead of improving, please call 911 or go directly to the emergency room immediately for evaluation.   Please schedule a Follow-up Appointment to: Return if symptoms worsen or fail to improve.  If you have any other questions or concerns, please feel free to call the office or send a message through [facility]. You may also schedule an earlier appointment if necessary.  Additionally, you may be receiving a survey about your experience at our office within a few days to 1 week by e-mail or mail. We value your feedback.  Nobie Putnam, DO [place]

## 2022-06-05 ENCOUNTER — Emergency Department: Payer: 59

## 2022-06-05 ENCOUNTER — Emergency Department
Admission: EM | Admit: 2022-06-05 | Discharge: 2022-06-05 | Disposition: A | Discharge status: Home / Self Care | Payer: 59 | Attending: Student in an Organized Health Care Education/Training Program | Admitting: Student in an Organized Health Care Education/Training Program

## 2022-06-05 ENCOUNTER — Other Ambulatory Visit: Payer: Self-pay

## 2022-06-05 ENCOUNTER — Encounter: Payer: Self-pay | Admitting: Emergency Medicine

## 2022-06-05 DIAGNOSIS — E039 Hypothyroidism, unspecified: Secondary | ICD-10-CM | POA: Diagnosis not present

## 2022-06-05 DIAGNOSIS — I10 Essential (primary) hypertension: Secondary | ICD-10-CM

## 2022-06-05 DIAGNOSIS — R079 Chest pain, unspecified: Secondary | ICD-10-CM | POA: Diagnosis not present

## 2022-06-05 DIAGNOSIS — R0789 Other chest pain: Secondary | ICD-10-CM | POA: Diagnosis not present

## 2022-06-05 DIAGNOSIS — M47814 Spondylosis without myelopathy or radiculopathy, thoracic region: Secondary | ICD-10-CM | POA: Diagnosis not present

## 2022-06-05 DIAGNOSIS — R519 Headache, unspecified: Secondary | ICD-10-CM | POA: Insufficient documentation

## 2022-06-05 DIAGNOSIS — N200 Calculus of kidney: Secondary | ICD-10-CM | POA: Diagnosis not present

## 2022-06-05 DIAGNOSIS — M47812 Spondylosis without myelopathy or radiculopathy, cervical region: Secondary | ICD-10-CM | POA: Diagnosis not present

## 2022-06-05 DIAGNOSIS — R000 Tachycardia, unspecified: Secondary | ICD-10-CM | POA: Diagnosis not present

## 2022-06-05 LAB — COMPREHENSIVE METABOLIC PANEL
ALT: 24 U/L (ref 0–44)
AST: 43 U/L — ABNORMAL HIGH (ref 15–41)
Albumin: 4.3 g/dL (ref 3.5–5.0)
Alkaline Phosphatase: 69 U/L (ref 38–126)
Anion gap: 10 (ref 5–15)
BUN: 14 mg/dL (ref 6–20)
CO2: 23 mmol/L (ref 22–32)
Calcium: 9.3 mg/dL (ref 8.9–10.3)
Chloride: 102 mmol/L (ref 98–111)
Creatinine, Ser: 0.97 mg/dL (ref 0.44–1.00)
GFR, Estimated: >60 mL/min (ref >60)
Glucose, Bld: 126 mg/dL — ABNORMAL HIGH (ref 70–99)
Potassium: 3 mmol/L — ABNORMAL LOW (ref 3.5–5.1)
Sodium: 135 mmol/L (ref 135–145)
Total Bilirubin: 0.6 mg/dL (ref 0.3–1.2)
Total Protein: 7.9 g/dL (ref 6.5–8.1)

## 2022-06-05 LAB — CBC WITH DIFFERENTIAL/PLATELET
Abs Immature Granulocytes: 0.05 10*3/uL (ref 0.00–0.07)
Basophils Absolute: 0 10*3/uL (ref 0.0–0.1)
Basophils Relative: 0 %
Eosinophils Absolute: 0.1 10*3/uL (ref 0.0–0.5)
Eosinophils Relative: 1 %
HCT: 44.9 % (ref 36.0–46.0)
Hemoglobin: 15.3 g/dL — ABNORMAL HIGH (ref 12.0–15.0)
Immature Granulocytes: 1 %
Lymphocytes Relative: 20 %
Lymphs Abs: 2.1 10*3/uL (ref 0.7–4.0)
MCH: 29.8 pg (ref 26.0–34.0)
MCHC: 34.1 g/dL (ref 30.0–36.0)
MCV: 87.4 fL (ref 80.0–100.0)
Monocytes Absolute: 0.5 10*3/uL (ref 0.1–1.0)
Monocytes Relative: 5 %
Neutro Abs: 7.9 10*3/uL — ABNORMAL HIGH (ref 1.7–7.7)
Neutrophils Relative %: 73 %
Platelets: 341 10*3/uL (ref 150–400)
RBC: 5.14 MIL/uL — ABNORMAL HIGH (ref 3.87–5.11)
RDW: 13 % (ref 11.5–15.5)
WBC: 10.7 10*3/uL — ABNORMAL HIGH (ref 4.0–10.5)
nRBC: 0 % (ref 0.0–0.2)

## 2022-06-05 LAB — TSH: TSH: 32.131 u[IU]/mL — ABNORMAL HIGH (ref 0.350–4.500)

## 2022-06-05 LAB — HCG, QUANTITATIVE, PREGNANCY: hCG, Beta Chain, Quant, S: <1 m[IU]/mL (ref <5)

## 2022-06-05 LAB — TROPONIN I (HIGH SENSITIVITY)
Troponin I (High Sensitivity): 8 ng/L (ref <18)
Troponin I (High Sensitivity): 9 ng/L (ref <18)

## 2022-06-05 LAB — T4, FREE: Free T4: 0.47 ng/dL — ABNORMAL LOW (ref 0.61–1.12)

## 2022-06-05 MED ORDER — IOHEXOL 350 MG/ML SOLN
80.0000 mL | Freq: Once | INTRAVENOUS | Status: AC | PRN
  Administered 2022-06-05: 60 mL via INTRAVENOUS

## 2022-06-05 MED ORDER — LEVOTHYROXINE SODIUM 175 MCG PO TABS: 175.0000 ug | ORAL_TABLET | Freq: Every day | ORAL | 1 refills | Status: DC

## 2022-06-05 MED ORDER — POTASSIUM CHLORIDE CRYS ER 20 MEQ PO TBCR
40.0000 meq | EXTENDED_RELEASE_TABLET | Freq: Once | ORAL | Status: AC
  Administered 2022-06-05: 40 meq via ORAL
  Filled 2022-06-05: qty 2

## 2022-06-05 MED ORDER — DIPHENHYDRAMINE HCL 50 MG/ML IJ SOLN
50.0000 mg | Freq: Once | INTRAMUSCULAR | Status: AC
  Administered 2022-06-05: 50 mg via INTRAVENOUS
  Filled 2022-06-05: qty 1

## 2022-06-05 MED ORDER — DIPHENHYDRAMINE HCL 50 MG/ML IJ SOLN: 50.0000 mg | Freq: Once | INTRAMUSCULAR | Status: DC

## 2022-06-05 MED ORDER — DIPHENHYDRAMINE HCL 25 MG PO CAPS: 50.0000 mg | ORAL_CAPSULE | Freq: Once | ORAL | Status: DC

## 2022-06-05 MED ORDER — METHYLPREDNISOLONE SODIUM SUCC 40 MG IJ SOLR: 40.0000 mg | Freq: Once | INTRAMUSCULAR | Status: DC

## 2022-06-05 MED ORDER — METHYLPREDNISOLONE SODIUM SUCC 40 MG IJ SOLR
40.0000 mg | Freq: Once | INTRAMUSCULAR | Status: AC
  Administered 2022-06-05: 40 mg via INTRAVENOUS
  Filled 2022-06-05: qty 1

## 2022-06-05 MED ORDER — DIPHENHYDRAMINE HCL 25 MG PO CAPS: 50.0000 mg | ORAL_CAPSULE | Freq: Once | ORAL | Status: AC

## 2022-06-05 NOTE — Addendum Note (Signed)
Addended by: Olin Hauser on: 06/05/2022 05:46 PM   Modules accepted: Orders

## 2022-06-05 NOTE — ED Triage Notes (Signed)
Patient ambulatory to triage with steady gait, without difficulty or distress noted; pt reports awoke with [facility] and nausea with upper/mid back pain; recent BP med changes; took 10mg  apresoline at 330am without relief, BP 196/110 at home

## 2022-06-05 NOTE — Addendum Note (Signed)
Addended by: Olin Hauser on: 06/05/2022 06:41 PM   Modules accepted: Orders

## 2022-06-05 NOTE — Discharge Instructions (Addendum)
Please talk to your primary care provider about your thyroid medications as well as your high blood pressure medications.  Your blood work today was reassuring.  You may also call the cardiologist for further blood pressure and chest pain management.  Please return the emergency department for any new, worsening, or change in symptoms or other concerns.  It was a pleasure caring for you today.

## 2022-06-05 NOTE — ED Provider Notes (Signed)
[facility] Provider Note    Event Date/Time   First MD Initiated Contact with Patient 06/05/22 [phone]     (approximate)   History   Hypertension   HPI  Jacqueline Walsh is a 41 y.o. female with a past medical history of hypertension, recent medication change given to failed lisinopril and failed amlodipine, currently on hydrochlorothiazide and newly started hydralazine PRN yesterday who presents today for evaluation of back pain and trouble breathing that awoke her from sleep at 3:30 AM this morning.  Patient reports that she has had trouble managing her blood pressure for several years.  She denies active chest pain, but reports that her upper back pain near her scapula is new.  She has not had any lower extremity swelling.  She reports that she feels very nauseated but has not had any vomiting.  She denies abdominal pain.  Patient Active Problem List   Diagnosis Date Noted   Hirsutism 11/11/2019   Menorrhagia with regular cycle 11/11/2019   Hashimoto's disease 01/20/2018   Essential hypertension 01/15/2013   Migraines 01/15/2013          Physical Exam   Triage Vital Signs: ED Triage Vitals  Enc Vitals Group     BP 06/05/22 0505 (!) 197/121     Pulse Rate 06/05/22 0505 (!) 115     Resp 06/05/22 0505 (!) 21     Temp --      Temp src --      SpO2 06/05/22 0505 97 %     Weight 06/05/22 0509 185 lb (83.9 kg)     Height 06/05/22 0509 5\' 3"  (1.6 m)     Head Circumference --      Peak Flow --      Pain Score 06/05/22 0509 8     Pain Loc --      Pain Edu? --      Excl. in [place]? --     Most recent vital signs: Vitals:   06/05/22 1021 06/05/22 1251  BP: 117/78 120/70  Pulse: (!) 46 62  Resp: 16 16  Temp:  98 F (36.7 C)  SpO2: 96% 96%    Physical Exam Vitals and nursing note reviewed.  Constitutional:      General: Awake and alert. No acute distress.    Appearance: Normal appearance. The patient is normal weight.  HENT:     Head:  Normocephalic and atraumatic.     Mouth: Mucous membranes are moist.  Eyes:     General: PERRL. Normal EOMs        Right eye: No discharge.        Left eye: No discharge.     Conjunctiva/sclera: Conjunctivae normal.  Cardiovascular:     Rate and Rhythm: tachycardic rate and regular rhythm.     Pulses: Normal pulses.  Equal in all 4 extremities    Heart sounds: Normal heart sounds Pulmonary:     Effort: Pulmonary effort is normal. No respiratory distress.     Breath sounds: Normal breath sounds.  Abdominal:     Abdomen is soft. There is no abdominal tenderness. No rebound or guarding. No distention. Musculoskeletal:        General: No swelling. Normal range of motion.     Cervical back: Normal range of motion and neck supple.  Skin:    General: Skin is warm and dry.     Capillary Refill: Capillary refill takes less than 2 seconds.  Findings: No rash.  Neurological:     Mental Status: The patient is awake and alert.  Mildly anxious appearing     ED Results / Procedures / Treatments   Labs (all labs ordered are listed, but only abnormal results are displayed) Labs Reviewed  CBC WITH DIFFERENTIAL/PLATELET - Abnormal; Notable for the following components:      Result Value   WBC 10.7 (*)    RBC 5.14 (*)    Hemoglobin 15.3 (*)    Neutro Abs 7.9 (*)    All other components within normal limits  COMPREHENSIVE METABOLIC PANEL - Abnormal; Notable for the following components:   Potassium 3.0 (*)    Glucose, Bld 126 (*)    AST 43 (*)    All other components within normal limits  TSH - Abnormal; Notable for the following components:   TSH 32.131 (*)    All other components within normal limits  T4, FREE - Abnormal; Notable for the following components:   Free T4 0.47 (*)    All other components within normal limits  HCG, QUANTITATIVE, PREGNANCY  TROPONIN I (HIGH SENSITIVITY)  TROPONIN I (HIGH SENSITIVITY)     EKG     RADIOLOGY I independently reviewed and  interpreted imaging and agree with radiologists findings.     PROCEDURES:  Critical Care performed:   Procedures   MEDICATIONS ORDERED IN ED: Medications  methylPREDNISolone sodium succinate (SOLU-MEDROL) 40 mg/mL injection 40 mg (has no administration in time range)  diphenhydrAMINE (BENADRYL) capsule 50 mg (has no administration in time range)    Or  diphenhydrAMINE (BENADRYL) injection 50 mg (has no administration in time range)  methylPREDNISolone sodium succinate (SOLU-MEDROL) 40 mg/mL injection 40 mg (40 mg Intravenous Given 06/05/22 0749)  diphenhydrAMINE (BENADRYL) capsule 50 mg ( Oral See Alternative 06/05/22 1102)    Or  diphenhydrAMINE (BENADRYL) injection 50 mg (50 mg Intravenous Given 06/05/22 1102)  potassium chloride SA (KLOR-CON M) CR tablet 40 mEq (40 mEq Oral Given 06/05/22 0846)  iohexol (OMNIPAQUE) 350 MG/ML injection 80 mL (60 mLs Intravenous Contrast Given 06/05/22 1124)     IMPRESSION / MDM / ASSESSMENT AND PLAN / ED COURSE  I reviewed the triage vital signs and the nursing notes.   Differential diagnosis includes, but is not limited to, acute coronary syndrome, aortic dissection, hypertensive urgency, hypertensive emergency, pulmonary embolism, dictation reaction.  Patient is awake and alert, tachycardic and hypertensive on arrival.  Pulses are equal in all 4 extremities.  However, given her elevated blood pressure and tachycardia and her chest pain that radiates directly to her back, she agreed to work-up including labs and CT angiogram.  Per chart review, it appears that patient has required this in the past and has had a similar presentation.  Her troponin was within normal limits.  She required premedication prior to CT given that she has a contrast allergy.  CT angiogram was negative.  Patient's blood pressure had normalized without intervention.  Her thyroid was noted to be abnormal as well, she appears to be hypothyroid.  She reports that her PCP has been  trying to manage her hypothyroidism.  I advised that she follow-up with her outpatient provider in the next 1 to 2 days to discuss her thyroid and blood pressure medications.  In the meantime, we discussed return precautions.  She was reassessed multiple times throughout her emergency department stay, and at the time of discharge, her vital signs were all normal and she was asymptomatic.  She understands strict  return precautions and the importance of prompt outpatient follow-up.  She was discharged in the care of her husband.   Patient's presentation is most consistent with acute presentation with potential threat to life or bodily function.   FINAL CLINICAL IMPRESSION(S) / ED DIAGNOSES   Final diagnoses:  Hypertension, unspecified type  Chest pain, unspecified type  Hypothyroidism, unspecified type     Rx / DC Orders   ED Discharge Orders     None        Note:  This document was prepared using Dragon voice recognition software and may include unintentional dictation errors.   Jackelyn Hoehn, PA-C 06/05/22 1405    Willy Eddy, MD 06/05/22 1455

## 2022-06-06 ENCOUNTER — Ambulatory Visit
Admission: RE | Admit: 2022-06-06 | Discharge: 2022-06-06 | Disposition: A | Discharge status: Home / Self Care | Payer: 59 | Source: Ambulatory Visit | Attending: Plastic Surgery | Admitting: Plastic Surgery

## 2022-06-06 ENCOUNTER — Other Ambulatory Visit: Payer: Self-pay | Admitting: Plastic Surgery

## 2022-06-06 DIAGNOSIS — T8543XA Leakage of breast prosthesis and implant, initial encounter: Secondary | ICD-10-CM

## 2022-06-06 DIAGNOSIS — N644 Mastodynia: Secondary | ICD-10-CM | POA: Diagnosis not present

## 2022-06-14 DIAGNOSIS — E062 Chronic thyroiditis with transient thyrotoxicosis: Secondary | ICD-10-CM | POA: Diagnosis not present

## 2022-06-14 DIAGNOSIS — E039 Hypothyroidism, unspecified: Secondary | ICD-10-CM | POA: Diagnosis not present

## 2022-07-09 ENCOUNTER — Other Ambulatory Visit: Payer: Self-pay | Admitting: Family Medicine

## 2022-07-09 DIAGNOSIS — E876 Hypokalemia: Secondary | ICD-10-CM

## 2022-07-09 MED ORDER — POTASSIUM CHLORIDE CRYS ER 10 MEQ PO TBCR: 10.0000 meq | EXTENDED_RELEASE_TABLET | Freq: Every day | ORAL | 0 refills | Status: DC

## 2022-07-19 DIAGNOSIS — E039 Hypothyroidism, unspecified: Secondary | ICD-10-CM | POA: Diagnosis not present

## 2022-07-23 DIAGNOSIS — E062 Chronic thyroiditis with transient thyrotoxicosis: Secondary | ICD-10-CM | POA: Diagnosis not present

## 2022-07-23 DIAGNOSIS — E039 Hypothyroidism, unspecified: Secondary | ICD-10-CM | POA: Diagnosis not present

## 2022-08-07 ENCOUNTER — Other Ambulatory Visit: Payer: Self-pay | Admitting: Otolaryngology

## 2022-08-07 DIAGNOSIS — E063 Autoimmune thyroiditis: Secondary | ICD-10-CM

## 2022-08-21 ENCOUNTER — Ambulatory Visit
Admission: RE | Admit: 2022-08-21 | Discharge: 2022-08-21 | Disposition: A | Discharge status: Home / Self Care | Payer: 59 | Source: Ambulatory Visit | Attending: Otolaryngology | Admitting: Otolaryngology

## 2022-08-21 DIAGNOSIS — E063 Autoimmune thyroiditis: Secondary | ICD-10-CM

## 2022-08-24 ENCOUNTER — Other Ambulatory Visit: Payer: Self-pay | Admitting: Family Medicine

## 2022-08-24 DIAGNOSIS — I160 Hypertensive urgency: Secondary | ICD-10-CM

## 2022-08-24 NOTE — Telephone Encounter (Signed)
Requested Prescriptions  Pending Prescriptions Disp Refills   valsartan (DIOVAN) 80 MG tablet [Pharmacy Med Name: VALSARTAN 80 MG TABLET] 90 tablet 0    Sig: TAKE 1 TABLET BY MOUTH EVERY DAY     Cardiovascular:  Angiotensin Receptor Blockers Failed - 08/24/2022 11:17 AM      Failed - K in normal range and within 180 days    Potassium  Date Value Ref Range Status  06/05/2022 3.0 (L) 3.5 - 5.1 mmol/L Final         Passed - Cr in normal range and within 180 days    Creat  Date Value Ref Range Status  06/04/2022 0.73 0.50 - 0.97 mg/dL Final   Creatinine, Ser  Date Value Ref Range Status  06/05/2022 0.97 0.44 - 1.00 mg/dL Final         Passed - Patient is not pregnant      Passed - Last BP in normal range    BP Readings from Last 1 Encounters:  06/05/22 120/70         Passed - Valid encounter within last 6 months    Recent Outpatient Visits           6 months ago Annual physical exam   [place], DO   10 months ago Synovitis of left hand   [place], Devonne Doughty, DO   1 year ago Annual physical exam   [facility] Olin Hauser, DO   2 years ago Essential hypertension   [facility] Olin Hauser, DO   4 years ago Acute non-recurrent pansinusitis   [facility] Merrilyn Puma, Jerrel Ivory, NP

## 2022-08-29 DIAGNOSIS — E039 Hypothyroidism, unspecified: Secondary | ICD-10-CM | POA: Diagnosis not present

## 2022-08-29 DIAGNOSIS — E062 Chronic thyroiditis with transient thyrotoxicosis: Secondary | ICD-10-CM | POA: Diagnosis not present

## 2022-09-21 DIAGNOSIS — E062 Chronic thyroiditis with transient thyrotoxicosis: Secondary | ICD-10-CM | POA: Diagnosis not present

## 2022-10-01 DIAGNOSIS — E0590 Thyrotoxicosis, unspecified without thyrotoxic crisis or storm: Secondary | ICD-10-CM | POA: Diagnosis not present

## 2022-10-01 DIAGNOSIS — E062 Chronic thyroiditis with transient thyrotoxicosis: Secondary | ICD-10-CM | POA: Diagnosis not present

## 2022-10-30 ENCOUNTER — Ambulatory Visit: Payer: 59 | Admitting: Nurse Practitioner

## 2022-11-09 DIAGNOSIS — Z6833 Body mass index (BMI) 33.0-33.9, adult: Secondary | ICD-10-CM | POA: Diagnosis not present

## 2022-11-09 DIAGNOSIS — R051 Acute cough: Secondary | ICD-10-CM | POA: Diagnosis not present

## 2022-11-09 DIAGNOSIS — I10 Essential (primary) hypertension: Secondary | ICD-10-CM | POA: Diagnosis not present

## 2022-11-09 DIAGNOSIS — J029 Acute pharyngitis, unspecified: Secondary | ICD-10-CM | POA: Diagnosis not present

## 2022-11-26 ENCOUNTER — Other Ambulatory Visit: Payer: Self-pay | Admitting: Family Medicine

## 2022-11-26 DIAGNOSIS — I160 Hypertensive urgency: Secondary | ICD-10-CM

## 2022-11-26 DIAGNOSIS — E062 Chronic thyroiditis with transient thyrotoxicosis: Secondary | ICD-10-CM | POA: Diagnosis not present

## 2022-11-26 NOTE — Progress Notes (Unsigned)
There were no vitals taken for this visit.   Subjective:    Patient ID: Jacqueline Walsh, female    DOB: 13-Oct-1980, 42 y.o.   MRN: [id number]  HPI: Jacqueline Walsh is a 42 y.o. female  No chief complaint on file.  Establish care: her last physical was ***.  Medical history includes ***.  Family history includes ***.  Health maintenance ***.   Relevant past medical, surgical, family and social history reviewed and updated as indicated. Interim medical history since our last visit reviewed. Allergies and medications reviewed and updated.  Review of Systems  Constitutional: Negative for fever or weight change.  Respiratory: Negative for cough and shortness of breath.   Cardiovascular: Negative for chest pain or palpitations.  Gastrointestinal: Negative for abdominal pain, no bowel changes.  Musculoskeletal: Negative for gait problem or joint swelling.  Skin: Negative for rash.  Neurological: Negative for dizziness or headache.  No other specific complaints in a complete review of systems (except as listed in HPI above).      Objective:    There were no vitals taken for this visit.  Wt Readings from Last 3 Encounters:  06/05/22 185 lb (83.9 kg)  02/13/22 184 lb 12.8 oz (83.8 kg)  01/02/22 184 lb (83.5 kg)    Physical Exam  Constitutional: Patient appears well-developed and well-nourished. Obese *** No distress.  HEENT: head atraumatic, normocephalic, pupils equal and reactive to light, ears ***, neck supple, throat within normal limits Cardiovascular: Normal rate, regular rhythm and normal heart sounds.  No murmur heard. No BLE edema. Pulmonary/Chest: Effort normal and breath sounds normal. No respiratory distress. Abdominal: Soft.  There is no tenderness. Psychiatric: Patient has a normal mood and affect. behavior is normal. Judgment and thought content normal.  Results for orders placed or performed during the hospital encounter of 06/05/22  CBC with Differential  Result Value  Ref Range   WBC 10.7 (H) 4.0 - 10.5 K/uL   RBC 5.14 (H) 3.87 - 5.11 MIL/uL   Hemoglobin 15.3 (H) 12.0 - 15.0 g/dL   HCT 33.6 12.2 - 44.9 %   MCV 87.4 80.0 - 100.0 fL   MCH 29.8 26.0 - 34.0 pg   MCHC 34.1 30.0 - 36.0 g/dL   RDW 75.3 00.5 - 11.0 %   Platelets 341 150 - 400 K/uL   nRBC 0.0 0.0 - 0.2 %   Neutrophils Relative % 73 %   Neutro Abs 7.9 (H) 1.7 - 7.7 K/uL   Lymphocytes Relative 20 %   Lymphs Abs 2.1 0.7 - 4.0 K/uL   Monocytes Relative 5 %   Monocytes Absolute 0.5 0.1 - 1.0 K/uL   Eosinophils Relative 1 %   Eosinophils Absolute 0.1 0.0 - 0.5 K/uL   Basophils Relative 0 %   Basophils Absolute 0.0 0.0 - 0.1 K/uL   Immature Granulocytes 1 %   Abs Immature Granulocytes 0.05 0.00 - 0.07 K/uL  Comprehensive metabolic panel  Result Value Ref Range   Sodium 135 135 - 145 mmol/L   Potassium 3.0 (L) 3.5 - 5.1 mmol/L   Chloride 102 98 - 111 mmol/L   CO2 23 22 - 32 mmol/L   Glucose, Bld 126 (H) 70 - 99 mg/dL   BUN 14 6 - 20 mg/dL   Creatinine, Ser 2.11 0.44 - 1.00 mg/dL   Calcium 9.3 8.9 - 17.3 mg/dL   Total Protein 7.9 6.5 - 8.1 g/dL   Albumin 4.3 3.5 - 5.0 g/dL   AST  43 (H) 15 - 41 U/L   ALT 24 0 - 44 U/L   Alkaline Phosphatase 69 38 - 126 U/L   Total Bilirubin 0.6 0.3 - 1.2 mg/dL   GFR, Estimated >35 >57 mL/min   Anion gap 10 5 - 15  hCG, quantitative, pregnancy  Result Value Ref Range   hCG, Beta Chain, Quant, S <1 <5 mIU/mL  TSH  Result Value Ref Range   TSH 32.131 (H) 0.350 - 4.500 uIU/mL  T4, free  Result Value Ref Range   Free T4 0.47 (L) 0.61 - 1.12 ng/dL  Troponin I (High Sensitivity)  Result Value Ref Range   Troponin I (High Sensitivity) 8 <18 ng/L  Troponin I (High Sensitivity)  Result Value Ref Range   Troponin I (High Sensitivity) 9 <18 ng/L      Assessment & Plan:   Problem List Items Addressed This Visit   None    Follow up plan: No follow-ups on file.

## 2022-11-27 ENCOUNTER — Other Ambulatory Visit: Payer: Self-pay

## 2022-11-27 ENCOUNTER — Encounter: Payer: Self-pay | Admitting: Nurse Practitioner

## 2022-11-27 ENCOUNTER — Ambulatory Visit: Payer: 59 | Admitting: Nurse Practitioner

## 2022-11-27 VITALS — BP 130/84 | HR 89 | Temp 98.0°F | Resp 16 | Ht 64.0 in | Wt 192.8 lb

## 2022-11-27 DIAGNOSIS — E063 Autoimmune thyroiditis: Secondary | ICD-10-CM | POA: Diagnosis not present

## 2022-11-27 DIAGNOSIS — Z13220 Encounter for screening for lipoid disorders: Secondary | ICD-10-CM

## 2022-11-27 DIAGNOSIS — E876 Hypokalemia: Secondary | ICD-10-CM | POA: Diagnosis not present

## 2022-11-27 DIAGNOSIS — Z7689 Persons encountering health services in other specified circumstances: Secondary | ICD-10-CM | POA: Diagnosis not present

## 2022-11-27 DIAGNOSIS — Z131 Encounter for screening for diabetes mellitus: Secondary | ICD-10-CM

## 2022-11-27 DIAGNOSIS — L409 Psoriasis, unspecified: Secondary | ICD-10-CM | POA: Diagnosis not present

## 2022-11-27 DIAGNOSIS — Z6833 Body mass index (BMI) 33.0-33.9, adult: Secondary | ICD-10-CM

## 2022-11-27 DIAGNOSIS — L4050 Arthropathic psoriasis, unspecified: Secondary | ICD-10-CM

## 2022-11-27 DIAGNOSIS — I10 Essential (primary) hypertension: Secondary | ICD-10-CM | POA: Diagnosis not present

## 2022-11-27 DIAGNOSIS — E6609 Other obesity due to excess calories: Secondary | ICD-10-CM | POA: Diagnosis not present

## 2022-11-27 MED ORDER — ZEPBOUND 2.5 MG/0.5ML ~~LOC~~ SOAJ
2.5000 mg | SUBCUTANEOUS | 0 refills | Status: DC
Start: 2022-11-27 — End: 2022-11-28

## 2022-11-27 MED ORDER — HYDRALAZINE HCL 10 MG PO TABS: 10.0000 mg | ORAL_TABLET | Freq: Three times a day (TID) | ORAL | 1 refills | Status: DC | PRN

## 2022-11-27 MED ORDER — HYDROCHLOROTHIAZIDE 25 MG PO TABS: 25.0000 mg | ORAL_TABLET | Freq: Every day | ORAL | 1 refills | Status: DC

## 2022-11-27 MED ORDER — VALSARTAN 80 MG PO TABS: 80.0000 mg | ORAL_TABLET | Freq: Every day | ORAL | 1 refills | Status: DC

## 2022-11-27 NOTE — Assessment & Plan Note (Signed)
Taking potassium 10 mEq daily.  We will get labs today.

## 2022-11-27 NOTE — Assessment & Plan Note (Signed)
Would like to try zepbound.  Prescription sent and we will try to get patient approved.  Discussed lifestyle modification.  Patient has been working on this

## 2022-11-27 NOTE — Telephone Encounter (Signed)
Requested medication (s) are due for refill today: yes  Requested medication (s) are on the active medication list: yes  Last refill:  08/24/22 #90/0  Future visit scheduled: yes today at 1300  Notes to clinic:  pt has NPA with Raynelle Fanning today and requesting refill. Please advise      Requested Prescriptions  Pending Prescriptions Disp Refills   valsartan (DIOVAN) 80 MG tablet [Pharmacy Med Name: VALSARTAN 80 MG TABLET] 30 tablet 2    Sig: TAKE 1 TABLET BY MOUTH EVERY DAY     Cardiovascular:  Angiotensin Receptor Blockers Failed - 11/26/2022  2:40 AM      Failed - K in normal range and within 180 days    Potassium  Date Value Ref Range Status  06/05/2022 3.0 (L) 3.5 - 5.1 mmol/L Final         Passed - Cr in normal range and within 180 days    Creat  Date Value Ref Range Status  06/04/2022 0.73 0.50 - 0.97 mg/dL Final   Creatinine, Ser  Date Value Ref Range Status  06/05/2022 0.97 0.44 - 1.00 mg/dL Final         Passed - Patient is not pregnant      Passed - Last BP in normal range    BP Readings from Last 1 Encounters:  06/05/22 120/70         Passed - Valid encounter within last 6 months    Recent Outpatient Visits           9 months ago Annual physical exam   [facility] Smitty Cords, DO   1 year ago Synovitis of left hand   [facility] Ashley, Netta Neat, DO   2 years ago Annual physical exam   [facility] Smitty Cords, DO   2 years ago Essential hypertension   [facility] Smitty Cords, DO   4 years ago Acute non-recurrent pansinusitis   [facility] Kyung Rudd, Alison Stalling, NP       Future Appointments             Today Zane Herald, Rudolpho Sevin, FNP [facility], [facility]

## 2022-11-27 NOTE — Assessment & Plan Note (Signed)
Schedule appointment with rheumatology

## 2022-11-27 NOTE — Assessment & Plan Note (Signed)
Continue care with endocrinology.

## 2022-11-27 NOTE — Assessment & Plan Note (Signed)
Pressure 130/84 today.  Continue taking hydrochlorothiazide 25 mg daily and valsartan 80 mg daily.  She also takes hydralazine 10 mg 3 times a day as needed.  She does not use this often.

## 2022-11-28 ENCOUNTER — Other Ambulatory Visit: Payer: Self-pay | Admitting: Nurse Practitioner

## 2022-11-28 ENCOUNTER — Encounter: Payer: Self-pay | Admitting: Nurse Practitioner

## 2022-11-28 DIAGNOSIS — E6609 Other obesity due to excess calories: Secondary | ICD-10-CM

## 2022-11-28 DIAGNOSIS — E782 Mixed hyperlipidemia: Secondary | ICD-10-CM

## 2022-11-28 LAB — CBC WITH DIFFERENTIAL/PLATELET
Absolute Monocytes: 421 cells/uL (ref 200–950)
Basophils Absolute: 43 cells/uL (ref 0–200)
Basophils Relative: 0.5 %
Eosinophils Absolute: 224 cells/uL (ref 15–500)
Eosinophils Relative: 2.6 %
HCT: 40.4 % (ref 35.0–45.0)
Hemoglobin: 13.6 g/dL (ref 11.7–15.5)
Lymphs Abs: 2520 cells/uL (ref 850–3900)
MCH: 30.4 pg (ref 27.0–33.0)
MCHC: 33.7 g/dL (ref 32.0–36.0)
MCV: 90.2 fL (ref 80.0–100.0)
MPV: 11.3 fL (ref 7.5–12.5)
Monocytes Relative: 4.9 %
Neutro Abs: 5392 cells/uL (ref 1500–7800)
Neutrophils Relative %: 62.7 %
Platelets: 315 10*3/uL (ref 140–400)
RBC: 4.48 10*6/uL (ref 3.80–5.10)
RDW: 13.2 % (ref 11.0–15.0)
Total Lymphocyte: 29.3 %
WBC: 8.6 10*3/uL (ref 3.8–10.8)

## 2022-11-28 LAB — COMPLETE METABOLIC PANEL WITH GFR
AG Ratio: 1.4 (calc) (ref 1.0–2.5)
ALT: 13 U/L (ref 6–29)
AST: 17 U/L (ref 10–30)
Albumin: 4.3 g/dL (ref 3.6–5.1)
Alkaline phosphatase (APISO): 59 U/L (ref 31–125)
BUN: 12 mg/dL (ref 7–25)
CO2: 23 mmol/L (ref 20–32)
Calcium: 9.6 mg/dL (ref 8.6–10.2)
Chloride: 101 mmol/L (ref 98–110)
Creat: 0.66 mg/dL (ref 0.50–0.99)
Globulin: 3.1 g/dL (calc) (ref 1.9–3.7)
Glucose, Bld: 74 mg/dL (ref 65–99)
Potassium: 3.8 mmol/L (ref 3.5–5.3)
Sodium: 138 mmol/L (ref 135–146)
Total Bilirubin: 0.6 mg/dL (ref 0.2–1.2)
Total Protein: 7.4 g/dL (ref 6.1–8.1)
eGFR: 114 mL/min/{1.73_m2} (ref >=60)

## 2022-11-28 LAB — HEMOGLOBIN A1C
Hgb A1c MFr Bld: 5.4 % of total Hgb (ref <5.7)
Mean Plasma Glucose: 108 mg/dL
eAG (mmol/L): 6 mmol/L

## 2022-11-28 LAB — LIPID PANEL
Cholesterol: 181 mg/dL (ref <200)
HDL: 42 mg/dL — ABNORMAL LOW (ref >=50)
Non-HDL Cholesterol (Calc): 139 mg/dL (calc) — ABNORMAL HIGH (ref <130)
Total CHOL/HDL Ratio: 4.3 (calc) (ref <5.0)
Triglycerides: 411 mg/dL — ABNORMAL HIGH (ref <150)

## 2022-11-28 MED ORDER — OZEMPIC (0.25 OR 0.5 MG/DOSE) 2 MG/3ML ~~LOC~~ SOPN
0.2500 mg | PEN_INJECTOR | SUBCUTANEOUS | 0 refills | Status: DC
Start: 2022-11-28 — End: 2023-04-02

## 2022-11-28 MED ORDER — CONTRAVE 8-90 MG PO TB12
ORAL_TABLET | ORAL | 0 refills | Status: DC
Start: 2022-11-28 — End: 2023-04-02

## 2022-11-28 MED ORDER — ATORVASTATIN CALCIUM 10 MG PO TABS
10.0000 mg | ORAL_TABLET | Freq: Every day | ORAL | 3 refills | Status: DC
Start: 2022-11-28 — End: 2023-10-08

## 2022-11-28 NOTE — Telephone Encounter (Signed)
Patient did call office back- she is aware of test results: Jacqueline Walsh, your triglycerides are high.  Have you ever taken anything for your cholesterol in the past?  I would like to start you on cholesterol lowering medication.  Your blood counts, kidney and liver function are normal. Your A1C which checks for diabetes is normal.   Patient will start atorvastatin- advised call office if she has any problems/SE with new medication

## 2022-12-03 DIAGNOSIS — E0590 Thyrotoxicosis, unspecified without thyrotoxic crisis or storm: Secondary | ICD-10-CM | POA: Diagnosis not present

## 2022-12-03 DIAGNOSIS — E062 Chronic thyroiditis with transient thyrotoxicosis: Secondary | ICD-10-CM | POA: Diagnosis not present

## 2023-01-23 ENCOUNTER — Other Ambulatory Visit: Payer: Self-pay | Admitting: Nurse Practitioner

## 2023-01-23 DIAGNOSIS — I10 Essential (primary) hypertension: Secondary | ICD-10-CM

## 2023-01-23 NOTE — Telephone Encounter (Signed)
Opened document section in error.   Disregard

## 2023-02-07 DIAGNOSIS — E062 Chronic thyroiditis with transient thyrotoxicosis: Secondary | ICD-10-CM | POA: Diagnosis not present

## 2023-03-06 ENCOUNTER — Encounter: Payer: Self-pay | Admitting: Nurse Practitioner

## 2023-03-06 ENCOUNTER — Emergency Department: Payer: 59

## 2023-03-06 ENCOUNTER — Emergency Department
Admission: EM | Admit: 2023-03-06 | Discharge: 2023-03-06 | Disposition: A | Discharge status: Home / Self Care | Payer: 59 | Attending: Emergency Medicine | Admitting: Emergency Medicine

## 2023-03-06 DIAGNOSIS — I10 Essential (primary) hypertension: Secondary | ICD-10-CM | POA: Diagnosis not present

## 2023-03-06 DIAGNOSIS — R112 Nausea with vomiting, unspecified: Secondary | ICD-10-CM | POA: Insufficient documentation

## 2023-03-06 DIAGNOSIS — R519 Headache, unspecified: Secondary | ICD-10-CM | POA: Diagnosis not present

## 2023-03-06 DIAGNOSIS — R202 Paresthesia of skin: Secondary | ICD-10-CM | POA: Diagnosis not present

## 2023-03-06 DIAGNOSIS — R079 Chest pain, unspecified: Secondary | ICD-10-CM | POA: Diagnosis not present

## 2023-03-06 LAB — CBC WITH DIFFERENTIAL/PLATELET
Abs Immature Granulocytes: 0.04 10*3/uL (ref 0.00–0.07)
Basophils Absolute: 0 10*3/uL (ref 0.0–0.1)
Basophils Relative: 1 %
Eosinophils Absolute: 0.1 10*3/uL (ref 0.0–0.5)
Eosinophils Relative: 1 %
HCT: 42.9 % (ref 36.0–46.0)
Hemoglobin: 14.4 g/dL (ref 12.0–15.0)
Immature Granulocytes: 1 %
Lymphocytes Relative: 16 %
Lymphs Abs: 1.4 10*3/uL (ref 0.7–4.0)
MCH: 30.1 pg (ref 26.0–34.0)
MCHC: 33.6 g/dL (ref 30.0–36.0)
MCV: 89.7 fL (ref 80.0–100.0)
Monocytes Absolute: 0.3 10*3/uL (ref 0.1–1.0)
Monocytes Relative: 4 %
Neutro Abs: 6.8 10*3/uL (ref 1.7–7.7)
Neutrophils Relative %: 77 %
Platelets: 331 10*3/uL (ref 150–400)
RBC: 4.78 MIL/uL (ref 3.87–5.11)
RDW: 12.5 % (ref 11.5–15.5)
WBC: 8.7 10*3/uL (ref 4.0–10.5)
nRBC: 0 % (ref 0.0–0.2)

## 2023-03-06 LAB — BASIC METABOLIC PANEL
Anion gap: 8 (ref 5–15)
BUN: 10 mg/dL (ref 6–20)
CO2: 24 mmol/L (ref 22–32)
Calcium: 8.7 mg/dL — ABNORMAL LOW (ref 8.9–10.3)
Chloride: 104 mmol/L (ref 98–111)
Creatinine, Ser: 0.67 mg/dL (ref 0.44–1.00)
GFR, Estimated: >60 mL/min (ref >60)
Glucose, Bld: 113 mg/dL — ABNORMAL HIGH (ref 70–99)
Potassium: 3.9 mmol/L (ref 3.5–5.1)
Sodium: 136 mmol/L (ref 135–145)

## 2023-03-06 LAB — T4, FREE: Free T4: 0.98 ng/dL (ref 0.61–1.12)

## 2023-03-06 LAB — TSH: TSH: 2.085 u[IU]/mL (ref 0.350–4.500)

## 2023-03-06 LAB — TROPONIN I (HIGH SENSITIVITY)
Troponin I (High Sensitivity): 3 ng/L (ref <18)
Troponin I (High Sensitivity): 3 ng/L (ref <18)

## 2023-03-06 MED ORDER — IRBESARTAN 75 MG PO TABS
75.0000 mg | ORAL_TABLET | Freq: Every day | ORAL | Status: DC
  Administered 2023-03-06: 75 mg via ORAL
  Filled 2023-03-06: qty 1

## 2023-03-06 MED ORDER — HYDROCHLOROTHIAZIDE 25 MG PO TABS
25.0000 mg | ORAL_TABLET | Freq: Every day | ORAL | Status: DC
  Administered 2023-03-06: 25 mg via ORAL
  Filled 2023-03-06: qty 1

## 2023-03-06 MED ORDER — ONDANSETRON 4 MG PO TBDP: 4.0000 mg | ORAL_TABLET | Freq: Three times a day (TID) | ORAL | 0 refills | Status: DC | PRN

## 2023-03-06 MED ORDER — CLONIDINE HCL 0.1 MG PO TABS: 0.1000 mg | ORAL_TABLET | Freq: Two times a day (BID) | ORAL | 0 refills | Status: DC | PRN

## 2023-03-06 NOTE — ED Provider Notes (Signed)
[facility] Provider Note    Event Date/Time   First MD Initiated Contact with Patient 03/06/23 1059     (approximate)   History   Hypertension (Patient presents with complaints of  hypertension (h/o), headache, tingling in her arms, and vomiting that began this morning; She says these symptoms are typical for her when her BP is too high);She checked her BP this morning and took her "emergency blood pressure pill" (Clonidine); BP in triage is 191/115)   HPI  Jacqueline Walsh is a 42 y.o. female  here with HTN. Pt reports a long h/o intermittent HTN, but it often comes in large spikes. Reports that earlier today she awoke with an aching, throbbing HA followed by nausea. She took her BP and it was 170s systolic. She took one of her rescue hydralazine doses  but then vomited about 10 min later, so she wasn't sure if she got the dose. Took another half dose and came in for evaluation.  she now feels back to baseline. Denies any current HA, chest pain, weakness, numbness.     Physical Exam   Triage Vital Signs: ED Triage Vitals  Encounter Vitals Group     BP 03/06/23 0919 (!) 191/115     Systolic BP Percentile --      Diastolic BP Percentile --      Pulse Rate 03/06/23 0919 82     Resp 03/06/23 0919 (!) 22     Temp 03/06/23 0919 97.8 F (36.6 C)     Temp Source 03/06/23 0919 Oral     SpO2 03/06/23 0919 100 %     Weight 03/06/23 0914 190 lb (86.2 kg)     Height 03/06/23 0914 5\' 4"  (1.626 m)     Head Circumference --      Peak Flow --      Pain Score 03/06/23 0914 8     Pain Loc --      Pain Education --      Exclude from Growth Chart --     Most recent vital signs: Vitals:   03/06/23 0919 03/06/23 1106  BP: (!) 191/115 (!) 139/91  Pulse: 82 76  Resp: (!) 22 20  Temp: 97.8 F (36.6 C)   SpO2: 100% 100%     General: Awake, no distress.  CV:  Good peripheral perfusion. RRR. No murmurs. Resp:  Normal work of breathing. Lungs clear  bilaterally. Abd:  No distention. No tenderness. Other:  CNII-XII intact. Strength 5/5 bl UE and LE. Normal sensation to light touch.   ED Results / Procedures / Treatments   Labs (all labs ordered are listed, but only abnormal results are displayed) Labs Reviewed  BASIC METABOLIC PANEL - Abnormal; Notable for the following components:      Result Value   Glucose, Bld 113 (*)    Calcium 8.7 (*)    All other components within normal limits  CBC WITH DIFFERENTIAL/PLATELET  TSH  T4, FREE  TROPONIN I (HIGH SENSITIVITY)  TROPONIN I (HIGH SENSITIVITY)     EKG Normal sinus rhythm, VR 84. PR 148, QRS 72, QTc 425. No acute ST elevations or depressions. No ischemia or infarct.   RADIOLOGY CT Head: NAICA   I also independently reviewed and agree with radiologist interpretations.   PROCEDURES:  Critical Care performed: No  .1-3 Lead EKG Interpretation  Performed by: Shaune Pollack, MD Authorized by: Shaune Pollack, MD     Interpretation: normal     ECG rate:  70-90   ECG rate assessment: normal     Rhythm: sinus rhythm     Ectopy: none     Conduction: normal   Comments:     Indication: HTN     MEDICATIONS ORDERED IN ED: Medications  irbesartan (AVAPRO) tablet 75 mg (75 mg Oral Given 03/06/23 1211)  hydrochlorothiazide (HYDRODIURIL) tablet 25 mg (25 mg Oral Given 03/06/23 1202)     IMPRESSION / MDM / ASSESSMENT AND PLAN / ED COURSE  I reviewed the triage vital signs and the nursing notes.                              Differential diagnosis includes, but is not limited to, symptomatic HTN, migraine, ICH, CVA, dehydration, AKI, ACS  Patient's presentation is most consistent with acute presentation with potential threat to life or bodily function.  The patient is on the cardiac monitor to evaluate for evidence of arrhythmia and/or significant heart rate changes  42 yo F with h/o HTN here with aching headache, nausea, vomiting now resolved after taking her PO  antiHTN. Pt is well appearing and in NAD. No focal neuro deficits. CT Head is normal. Trop negative, EKG non ischemic - do not suspect ACS. She has no focal neuro deficits and I suspect her HA was 2/2 her HTN which is now resolved. TSH/Ft4 normal. CBC unremarkable. Lytes acceptable/at baseline.  WIll have pt start monitoring her BP more readily at home, and give her zofran PRN. No apparent signs of HTN emergency at this time. We discussed management of her intermittent BP spikes with clonidine vs hydralazine, and will refer her to a HTN specialist for further evaluation.    FINAL CLINICAL IMPRESSION(S) / ED DIAGNOSES   Final diagnoses:  Primary hypertension     Rx / DC Orders   ED Discharge Orders          Ordered    ondansetron (ZOFRAN-ODT) 4 MG disintegrating tablet  Every 8 hours PRN        03/06/23 1243    cloNIDine (CATAPRES) 0.1 MG tablet  2 times daily PRN        03/06/23 1245             Note:  This document was prepared using Dragon voice recognition software and may include unintentional dictation errors.   Shaune Pollack, MD 03/06/23 1312

## 2023-03-06 NOTE — Discharge Instructions (Addendum)
Start measuring your blood pressure daily, at the same time, at rest  If you have NO symptoms, just record your pressure and bring to your primary doctor - you do not need to seek care if you have no symptoms such as headache, chest pain, SOB, dizziness, nausea  If you DO have symptoms, take your Zofran 4 mg tablet for nausea. Then, you have two options: - Take the HYDRALAZINE you have been taking, and wait 15-20 minutes. - Take the CLONIDINE which I have prescribed, and wait 15-20 minutes  IF your blood pressure is very elevated AFTER taking this, you can REPEAT the Clonidine dose and/or seek medical attention.   IF you took the Hyralazine dose first and it had no effect, you can try the CLONIDINE dose after 20-30 minutes once.   Take your Zofran then Hydralazine about 10 min later, and rest.   Take your other medications as prescribed  Follow-up with a Nephrology specialist for your blood pressure - I'd recommend calling your primary to discuss your ER visit and referral

## 2023-03-06 NOTE — ED Triage Notes (Signed)
Patient presents with complaints of  hypertension (h/o), headache, tingling in her arms, and vomiting that began this morning; She says these symptoms are typical for her when her BP is too high);She checked her BP this morning and took her "emergency blood pressure pill" (Clonidine); BP in triage is 191/115

## 2023-03-07 ENCOUNTER — Other Ambulatory Visit: Payer: Self-pay | Admitting: Nurse Practitioner

## 2023-03-07 DIAGNOSIS — I10 Essential (primary) hypertension: Secondary | ICD-10-CM

## 2023-03-07 NOTE — Telephone Encounter (Signed)
Do you need to follow up with her or will you do a referral

## 2023-04-02 ENCOUNTER — Other Ambulatory Visit: Payer: Self-pay | Admitting: Nurse Practitioner

## 2023-04-02 ENCOUNTER — Telehealth: Payer: Self-pay | Admitting: Nurse Practitioner

## 2023-04-02 DIAGNOSIS — I10 Essential (primary) hypertension: Secondary | ICD-10-CM | POA: Diagnosis not present

## 2023-04-02 DIAGNOSIS — E6609 Other obesity due to excess calories: Secondary | ICD-10-CM

## 2023-04-02 DIAGNOSIS — E876 Hypokalemia: Secondary | ICD-10-CM | POA: Diagnosis not present

## 2023-04-02 MED ORDER — OZEMPIC (0.25 OR 0.5 MG/DOSE) 2 MG/3ML ~~LOC~~ SOPN
0.2500 mg | PEN_INJECTOR | SUBCUTANEOUS | 0 refills | Status: DC
Start: 2023-04-02 — End: 2023-06-24

## 2023-04-02 NOTE — Telephone Encounter (Signed)
Drew with [facility] called in wanting to know if the provider was ok with him doing a compound of the Semaglutide,0.25 or 0.5MG /DOS, (OZEMPIC, 0.25 OR 0.5 MG/DOSE,) 2 MG/3ML SOPN . He states please call if any questions. Please assist patient further

## 2023-04-03 ENCOUNTER — Encounter: Payer: Self-pay | Admitting: Nephrology

## 2023-04-03 NOTE — Telephone Encounter (Signed)
Patient notified

## 2023-04-03 NOTE — Telephone Encounter (Signed)
FYI

## 2023-04-03 NOTE — Telephone Encounter (Signed)
Pharmacy notified.

## 2023-04-05 ENCOUNTER — Other Ambulatory Visit (INDEPENDENT_AMBULATORY_CARE_PROVIDER_SITE_OTHER): Payer: Self-pay | Admitting: Nephrology

## 2023-04-05 DIAGNOSIS — I10 Essential (primary) hypertension: Secondary | ICD-10-CM

## 2023-04-08 ENCOUNTER — Ambulatory Visit (INDEPENDENT_AMBULATORY_CARE_PROVIDER_SITE_OTHER): Payer: 59

## 2023-04-08 DIAGNOSIS — I10 Essential (primary) hypertension: Secondary | ICD-10-CM

## 2023-05-16 DIAGNOSIS — E0590 Thyrotoxicosis, unspecified without thyrotoxic crisis or storm: Secondary | ICD-10-CM | POA: Diagnosis not present

## 2023-05-21 DIAGNOSIS — E062 Chronic thyroiditis with transient thyrotoxicosis: Secondary | ICD-10-CM | POA: Diagnosis not present

## 2023-05-21 DIAGNOSIS — E039 Hypothyroidism, unspecified: Secondary | ICD-10-CM | POA: Diagnosis not present

## 2023-06-17 ENCOUNTER — Encounter: Payer: Self-pay | Admitting: Obstetrics and Gynecology

## 2023-06-18 ENCOUNTER — Ambulatory Visit: Payer: 59 | Admitting: Obstetrics and Gynecology

## 2023-06-24 ENCOUNTER — Other Ambulatory Visit: Payer: Self-pay | Admitting: Nurse Practitioner

## 2023-06-24 ENCOUNTER — Encounter: Payer: Self-pay | Admitting: Nurse Practitioner

## 2023-06-24 DIAGNOSIS — I10 Essential (primary) hypertension: Secondary | ICD-10-CM

## 2023-06-24 DIAGNOSIS — Z6833 Body mass index (BMI) 33.0-33.9, adult: Secondary | ICD-10-CM

## 2023-06-24 MED ORDER — VALSARTAN 80 MG PO TABS
80.0000 mg | ORAL_TABLET | Freq: Every day | ORAL | 1 refills | Status: DC
Start: 2023-06-24 — End: 2023-10-08

## 2023-06-24 MED ORDER — OZEMPIC (0.25 OR 0.5 MG/DOSE) 2 MG/3ML ~~LOC~~ SOPN
0.2500 mg | PEN_INJECTOR | SUBCUTANEOUS | 0 refills | Status: DC
Start: 2023-06-24 — End: 2023-07-01

## 2023-06-27 ENCOUNTER — Telehealth: Payer: Self-pay | Admitting: Nurse Practitioner

## 2023-06-27 ENCOUNTER — Encounter: Payer: Self-pay | Admitting: Nurse Practitioner

## 2023-06-27 NOTE — Telephone Encounter (Signed)
Left message for patient to call me back to discuss.

## 2023-06-27 NOTE — Telephone Encounter (Signed)
Pt is calling in returning a call from [facility], please follow up with pt.

## 2023-06-28 NOTE — Telephone Encounter (Signed)
Pt is returning Ms. Helen's call. Pt requested a callback today, if possible, stated she needs to clarify her medication dose.   Please advise.

## 2023-06-28 NOTE — Telephone Encounter (Signed)
Currently taking 25 unite of the ozempic. She has only lost 10 lbs. Spoke to pharmacy and he stated to her to discuss if it was ok to go to increase to 50 units

## 2023-06-28 NOTE — Telephone Encounter (Signed)
Left message for patient to call and discuss medication dosage

## 2023-07-01 ENCOUNTER — Other Ambulatory Visit: Payer: Self-pay | Admitting: Nurse Practitioner

## 2023-07-01 DIAGNOSIS — E66811 Obesity, class 1: Secondary | ICD-10-CM

## 2023-07-01 MED ORDER — OZEMPIC (0.25 OR 0.5 MG/DOSE) 2 MG/3ML ~~LOC~~ SOPN
0.5000 mg | PEN_INJECTOR | SUBCUTANEOUS | 0 refills | Status: DC
Start: 2023-07-01 — End: 2023-09-10

## 2023-07-01 NOTE — Telephone Encounter (Signed)
Pt verbalized understanding.

## 2023-07-02 NOTE — Progress Notes (Unsigned)
PCP:  Berniece Salines, FNP   No chief complaint on file.    HPI:      [name] is a 42 y.o. No obstetric history on file. who LMP was No LMP recorded. (Menstrual status: Bilateral Tubal Ligation)., presents today for her annual examination.  Her menses are now random, light spotting for a few days, no dysmen. Hx of menorrhagia; periods were 6-8 days of heavy flow, with 1/2 dollar sized clots last yr before IUD.    Sex activity: single partner, contraception - tubal ligation/Mirena placed 03/22/20 for menorrhagia; no pain /bleeding Last Pap: 11/11/19 with MDL; Results were: no abnormalities /neg HPV DNA. No hx of abn paps.   Last mammo: 06/06/22 Results were normal; due for breast pain and concern for ruptured implant There is no FH of breast cancer. There is no FH of ovarian cancer. The patient does do self-breast exams. Pt with implants; had CT scan that showed rupture implant. Pt's plastic surgeon has since deceased and she has not had f/u.    Tobacco use: The patient denies current or previous tobacco use. Alcohol use: occas No drug use.  Exercise: moderately active  She does get adequate calcium and Vitamin D in her diet. Labs with PCP  Past Medical History:  Diagnosis Date   Hypertension    Hypothyroidism    Migraine, unspecified, without mention of intractable migraine without mention of status migrainosus    Thyroid disease     Past Surgical History:  Procedure Laterality Date   LAPAROSCOPIC APPENDECTOMY N/A 01/07/2018   Procedure: APPENDECTOMY LAPAROSCOPIC;  Surgeon: Leafy Ro, MD;  Location: [facility] ORS;  Service: General;  Laterality: N/A;   TUBAL LIGATION      Family History  Problem Relation Age of Onset   Hyperlipidemia Mother    Transient ischemic attack Mother    Thyroid disease Father    Hypercholesterolemia Father    Thyroid disease Sister    Bladder Cancer Maternal Grandmother    Hypertension Neg Hx    Breast cancer Neg Hx    Colon cancer  Neg Hx     Social History   Socioeconomic History   Marital status: Married    Spouse name: Not on file   Number of children: 3   Years of education: Not on file   Highest education level: Not on file  Occupational History   Not on file  Tobacco Use   Smoking status: Never   Smokeless tobacco: Never  Vaping Use   Vaping status: Never Used  Substance and Sexual Activity   Alcohol use: No    Comment: occas   Drug use: No   Sexual activity: Yes    Birth control/protection: None, Surgical, I.U.D.    Comment: Mirena, Tubal ligation  Other Topics Concern   Not on file  Social History Narrative   Not on file   Social Determinants of Health   Financial Resource Strain: Not on file  Food Insecurity: Not on file  Transportation Needs: Not on file  Physical Activity: Not on file  Stress: Not on file  Social Connections: Not on file  Intimate Partner Violence: Not on file     Current Outpatient Medications:    atorvastatin (LIPITOR) 10 MG tablet, Take 1 tablet (10 mg total) by mouth daily., Disp: 90 tablet, Rfl: 3   cloNIDine (CATAPRES) 0.1 MG tablet, Take 1 tablet (0.1 mg total) by mouth 2 (two) times daily as needed (BP>180/110 with symptoms, not  responsive to your usual medication)., Disp: 20 tablet, Rfl: 0   hydrALAZINE (APRESOLINE) 10 MG tablet, TAKE 1 TABLET BY MOUTH THREE TIMES DAILY AS NEEDED FOR  ELEVATED  BLOOD  PRESSURE  (GREATER  THAN  160/100), Disp: 90 tablet, Rfl: 0   hydrochlorothiazide (HYDRODIURIL) 25 MG tablet, Take 1 tablet (25 mg total) by mouth daily., Disp: 90 tablet, Rfl: 1   levonorgestrel (MIRENA, 52 MG,) 20 MCG/24HR IUD, 1 Intra Uterine Device (1 each total) by Intrauterine route once for 1 dose., Disp: 1 Intra Uterine Device, Rfl: 0   levothyroxine (SYNTHROID) 175 MCG tablet, Take 1 tablet (175 mcg total) by mouth daily before breakfast., Disp: 90 tablet, Rfl: 1   Multiple Vitamin (MULTIVITAMIN WITH MINERALS) TABS tablet, Take 1 tablet by mouth daily.  , Disp: , Rfl:    ondansetron (ZOFRAN-ODT) 4 MG disintegrating tablet, Take 1 tablet (4 mg total) by mouth every 8 (eight) hours as needed for nausea or vomiting (Take with or before your high blood pressure rescue medication)., Disp: 20 tablet, Rfl: 0   potassium chloride (KLOR-CON M) 10 MEQ tablet, Take 1 tablet (10 mEq total) by mouth daily., Disp: 30 tablet, Rfl: 0   Semaglutide,0.25 or 0.5MG /DOS, (OZEMPIC, 0.25 OR 0.5 MG/DOSE,) 2 MG/3ML SOPN, Inject 0.5 mg into the skin once a week., Disp: 3 mL, Rfl: 0   valsartan (DIOVAN) 80 MG tablet, Take 1 tablet (80 mg total) by mouth daily., Disp: 90 tablet, Rfl: 1     ROS:  Review of Systems  Constitutional:  Negative for fatigue, fever and unexpected weight change.  Respiratory:  Negative for cough, shortness of breath and wheezing.   Cardiovascular:  Negative for chest pain, palpitations and leg swelling.  Gastrointestinal:  Negative for blood in stool, constipation, diarrhea, nausea and vomiting.  Endocrine: Negative for cold intolerance, heat intolerance and polyuria.  Genitourinary:  Negative for dyspareunia, dysuria, flank pain, frequency, genital sores, hematuria, menstrual problem, pelvic pain, urgency, vaginal bleeding, vaginal discharge and vaginal pain.  Musculoskeletal:  Negative for back pain, joint swelling and myalgias.  Skin:  Negative for rash.  Neurological:  Negative for dizziness, syncope, light-headedness, numbness and headaches.  Hematological:  Negative for adenopathy.  Psychiatric/Behavioral:  Negative for agitation, confusion, sleep disturbance and suicidal ideas. The patient is not nervous/anxious.   BREAST: No symptoms   Objective: There were no vitals taken for this visit.   Physical Exam Constitutional:      Appearance: She is well-developed.  Genitourinary:     Vulva normal.     Right Labia: No rash, tenderness or lesions.    Left Labia: No tenderness, lesions or rash.    No vaginal discharge, erythema or  tenderness.      Right Adnexa: not tender and no mass present.    Left Adnexa: not tender and no mass present.    No cervical friability or polyp.     IUD strings visualized.     Uterus is not enlarged or tender.  Breasts:    Right: No mass, nipple discharge, skin change or tenderness.     Left: No mass, nipple discharge, skin change or tenderness.  Neck:     Thyroid: No thyromegaly.  Cardiovascular:     Rate and Rhythm: Normal rate and regular rhythm.     Heart sounds: Normal heart sounds. No murmur heard. Pulmonary:     Effort: Pulmonary effort is normal.     Breath sounds: Normal breath sounds.  Abdominal:     Palpations:  Abdomen is soft.     Tenderness: There is no abdominal tenderness. There is no guarding or rebound.  Musculoskeletal:        General: Normal range of motion.     Cervical back: Normal range of motion.  Lymphadenopathy:     Cervical: No cervical adenopathy.  Neurological:     General: No focal deficit present.     Mental Status: She is alert and oriented to person, place, and time.     Cranial Nerves: No cranial nerve deficit.  Skin:    General: Skin is warm and dry.  Psychiatric:        Mood and Affect: Mood normal.        Behavior: Behavior normal.        Thought Content: Thought content normal.        Judgment: Judgment normal.  Vitals reviewed.     Assessment/Plan: Encounter for annual routine gynecological examination  Encounter for routine checking of intrauterine contraceptive device (IUD)--IUD strings in cx os; has 8 yr indication  Menorrhagia with regular cycle--sx resolved with IUD  Rupture of implant of left breast, initial encounter--pt to f/u with plastic surgeon; recommended [facility]. Izora Ribas in [facility]           GYN counsel adequate intake of calcium and vitamin D, diet and exercise     F/U  No follow-ups on file.  Corleen Otwell B. Siera Beyersdorf, PA-C 07/02/2023 12:10 PM

## 2023-07-04 ENCOUNTER — Other Ambulatory Visit (HOSPITAL_COMMUNITY)
Admission: RE | Admit: 2023-07-04 | Discharge: 2023-07-04 | Disposition: A | Discharge status: Home / Self Care | Payer: 59 | Source: Ambulatory Visit | Attending: Obstetrics and Gynecology | Admitting: Obstetrics and Gynecology

## 2023-07-04 ENCOUNTER — Ambulatory Visit (INDEPENDENT_AMBULATORY_CARE_PROVIDER_SITE_OTHER): Payer: 59 | Admitting: Obstetrics and Gynecology

## 2023-07-04 ENCOUNTER — Encounter: Payer: Self-pay | Admitting: Obstetrics and Gynecology

## 2023-07-04 VITALS — BP 117/76 | HR 81 | Ht 64.0 in | Wt 180.0 lb

## 2023-07-04 DIAGNOSIS — E039 Hypothyroidism, unspecified: Secondary | ICD-10-CM | POA: Diagnosis not present

## 2023-07-04 DIAGNOSIS — Z01419 Encounter for gynecological examination (general) (routine) without abnormal findings: Secondary | ICD-10-CM

## 2023-07-04 DIAGNOSIS — Z124 Encounter for screening for malignant neoplasm of cervix: Secondary | ICD-10-CM | POA: Diagnosis not present

## 2023-07-04 DIAGNOSIS — Z1151 Encounter for screening for human papillomavirus (HPV): Secondary | ICD-10-CM | POA: Insufficient documentation

## 2023-07-04 DIAGNOSIS — Z1231 Encounter for screening mammogram for malignant neoplasm of breast: Secondary | ICD-10-CM

## 2023-07-04 DIAGNOSIS — Z30431 Encounter for routine checking of intrauterine contraceptive device: Secondary | ICD-10-CM

## 2023-07-04 NOTE — Patient Instructions (Addendum)
I value your feedback and you entrusting us with your care. If you get a [facility] patient survey, I would appreciate you taking the time to let us know about your experience today. Thank you!  [facility] ([place])--[phone]  

## 2023-07-05 LAB — CYTOLOGY - PAP
Adequacy: ABSENT
Comment: NEGATIVE
Diagnosis: NEGATIVE
High risk HPV: NEGATIVE

## 2023-07-27 ENCOUNTER — Other Ambulatory Visit: Payer: Self-pay | Admitting: Nurse Practitioner

## 2023-07-27 DIAGNOSIS — I10 Essential (primary) hypertension: Secondary | ICD-10-CM

## 2023-07-29 NOTE — Telephone Encounter (Signed)
Called pt - left message on machine to return call to schedule appt.

## 2023-07-29 NOTE — Telephone Encounter (Signed)
Courtesy refill  - Patient will need an office visit for further refills. Requested Prescriptions  Pending Prescriptions Disp Refills   hydrochlorothiazide (HYDRODIURIL) 25 MG tablet [Pharmacy Med Name: hydroCHLOROthiazide 25 MG Oral Tablet] 30 tablet 0    Sig: Take 1 tablet by mouth once daily     Cardiovascular: Diuretics - Thiazide Failed - 07/27/2023  6:42 AM      Failed - Valid encounter within last 6 months    Recent Outpatient Visits           8 months ago Essential hypertension   [facility] Berniece Salines, FNP   1 year ago Annual physical exam   [facility] Smitty Cords, DO   1 year ago Synovitis of left hand   [facility] [place], Netta Neat, DO   2 years ago Annual physical exam   [place] [facility] Smitty Cords, DO   3 years ago Essential hypertension   [place] [facility] [place], Netta Neat, DO              Passed - Cr in normal range and within 180 days    Creat  Date Value Ref Range Status  11/27/2022 0.66 0.50 - 0.99 mg/dL Final   Creatinine, Ser  Date Value Ref Range Status  03/06/2023 0.67 0.44 - 1.00 mg/dL Final         Passed - K in normal range and within 180 days    Potassium  Date Value Ref Range Status  03/06/2023 3.9 3.5 - 5.1 mmol/L Final         Passed - Na in normal range and within 180 days    Sodium  Date Value Ref Range Status  03/06/2023 136 135 - 145 mmol/L Final         Passed - Last BP in normal range    BP Readings from Last 1 Encounters:  07/04/23 117/76

## 2023-08-01 ENCOUNTER — Ambulatory Visit
Admission: RE | Admit: 2023-08-01 | Discharge: 2023-08-01 | Disposition: A | Discharge status: Home / Self Care | Payer: 59 | Source: Ambulatory Visit | Attending: Obstetrics and Gynecology | Admitting: Obstetrics and Gynecology

## 2023-08-01 DIAGNOSIS — Z1231 Encounter for screening mammogram for malignant neoplasm of breast: Secondary | ICD-10-CM | POA: Diagnosis not present

## 2023-09-10 ENCOUNTER — Other Ambulatory Visit: Payer: Self-pay | Admitting: Nurse Practitioner

## 2023-09-10 DIAGNOSIS — E66811 Obesity, class 1: Secondary | ICD-10-CM

## 2023-09-10 MED ORDER — OZEMPIC (0.25 OR 0.5 MG/DOSE) 2 MG/3ML ~~LOC~~ SOPN
0.5000 mg | PEN_INJECTOR | SUBCUTANEOUS | 0 refills | Status: DC
Start: 2023-09-10 — End: 2023-09-12

## 2023-09-12 ENCOUNTER — Other Ambulatory Visit: Payer: Self-pay | Admitting: Nurse Practitioner

## 2023-09-12 ENCOUNTER — Encounter: Payer: Self-pay | Admitting: Nurse Practitioner

## 2023-09-12 DIAGNOSIS — E6609 Other obesity due to excess calories: Secondary | ICD-10-CM

## 2023-09-12 MED ORDER — SEMAGLUTIDE (1 MG/DOSE) 4 MG/3ML ~~LOC~~ SOPN: 1.0000 mg | PEN_INJECTOR | SUBCUTANEOUS | 0 refills | Status: DC

## 2023-09-13 ENCOUNTER — Other Ambulatory Visit: Payer: Self-pay | Admitting: Nurse Practitioner

## 2023-09-13 DIAGNOSIS — I10 Essential (primary) hypertension: Secondary | ICD-10-CM

## 2023-09-13 DIAGNOSIS — E0520 Thyrotoxicosis with toxic multinodular goiter without thyrotoxic crisis or storm: Secondary | ICD-10-CM | POA: Diagnosis not present

## 2023-09-13 NOTE — Telephone Encounter (Signed)
Patient called, left VM to return the call to the office to scheduled an appt for medication refill request.

## 2023-09-13 NOTE — Telephone Encounter (Signed)
Requested medication (s) are due for refill today: yes  Requested medication (s) are on the active medication list: yes  Last refill:  07/29/23 #30/0  Future visit scheduled: no  Notes to clinic:  pt is due for OV and updated labs, called [facility]      Requested Prescriptions  Pending Prescriptions Disp Refills   hydrochlorothiazide (HYDRODIURIL) 25 MG tablet [Pharmacy Med Name: hydroCHLOROthiazide 25 MG Oral Tablet] 30 tablet 0    Sig: TAKE 1 TABLET BY MOUTH ONCE DAILY . APPOINTMENT REQUIRED FOR FUTURE REFILLS     Cardiovascular: Diuretics - Thiazide Failed - 09/13/2023  3:22 PM      Failed - Cr in normal range and within 180 days    Creat  Date Value Ref Range Status  11/27/2022 0.66 0.50 - 0.99 mg/dL Final   Creatinine, Ser  Date Value Ref Range Status  03/06/2023 0.67 0.44 - 1.00 mg/dL Final         Failed - K in normal range and within 180 days    Potassium  Date Value Ref Range Status  03/06/2023 3.9 3.5 - 5.1 mmol/L Final         Failed - Na in normal range and within 180 days    Sodium  Date Value Ref Range Status  03/06/2023 136 135 - 145 mmol/L Final         Failed - Valid encounter within last 6 months    Recent Outpatient Visits           9 months ago Essential hypertension   [facility] Berniece Salines, FNP   1 year ago Annual physical exam   [facility] Smitty Cords, DO   1 year ago Synovitis of left hand   [place] [facility] Smitty Cords, DO   2 years ago Annual physical exam   [facility] [facility] Smitty Cords, DO   3 years ago Essential hypertension   [place] [facility] Smitty Cords, DO              Passed - Last BP in normal range    BP Readings from Last 1 Encounters:  07/04/23 117/76

## 2023-10-07 ENCOUNTER — Encounter: Payer: Self-pay | Admitting: Nurse Practitioner

## 2023-10-07 ENCOUNTER — Other Ambulatory Visit: Payer: Self-pay

## 2023-10-07 ENCOUNTER — Ambulatory Visit: Payer: 59 | Admitting: Nurse Practitioner

## 2023-10-07 VITALS — BP 130/70 | HR 98 | Temp 98.6°F | Resp 16 | Ht 64.0 in | Wt 174.2 lb

## 2023-10-07 DIAGNOSIS — E063 Autoimmune thyroiditis: Secondary | ICD-10-CM | POA: Diagnosis not present

## 2023-10-07 DIAGNOSIS — E66811 Obesity, class 1: Secondary | ICD-10-CM

## 2023-10-07 DIAGNOSIS — E6609 Other obesity due to excess calories: Secondary | ICD-10-CM

## 2023-10-07 DIAGNOSIS — L4050 Arthropathic psoriasis, unspecified: Secondary | ICD-10-CM

## 2023-10-07 DIAGNOSIS — E782 Mixed hyperlipidemia: Secondary | ICD-10-CM | POA: Insufficient documentation

## 2023-10-07 DIAGNOSIS — Z6833 Body mass index (BMI) 33.0-33.9, adult: Secondary | ICD-10-CM

## 2023-10-07 DIAGNOSIS — Z131 Encounter for screening for diabetes mellitus: Secondary | ICD-10-CM

## 2023-10-07 DIAGNOSIS — I10 Essential (primary) hypertension: Secondary | ICD-10-CM

## 2023-10-07 NOTE — Assessment & Plan Note (Signed)
Currently taking lipitor 10mg  daily, labs pending

## 2023-10-07 NOTE — Assessment & Plan Note (Signed)
Stable, not currently on treatment.

## 2023-10-07 NOTE — Assessment & Plan Note (Signed)
Currently monitoring BPs at home with a baseline of 130s/70s. Denies any recent episodes of HTN, stable BP during today's visit, will refill hydrochlorothiazide today. Will continue current medications and dosage: Amlodipine 5mg  daily, hydrochlorothiazide 25mg  daily, valsartan 80mg  daily, and hydralazine 10mg  PRN for elevated BPs greater than 160/100.

## 2023-10-07 NOTE — Progress Notes (Signed)
BP 130/70 (Cuff Size: Normal)   Pulse 98   Temp 98.6 F (37 C) (Oral)   Resp 16   Ht 5\' 4"  (1.626 m)   Wt 174 lb 3.2 oz (79 kg)   SpO2 98%   BMI 29.90 kg/m    Subjective:    Patient ID: Jacqueline Walsh, female    DOB: 1980/09/03, 43 y.o.   MRN: [id number]  HPI: Jacqueline Walsh is a 43 y.o. female  Chief Complaint  Patient presents with   Medical Management of Chronic Issues   Hypertension:  -Medications: amlodipine 5mg  daily at bedtime, hydrochlorothiazide 10mg  daily, valsartan 80mg  daily, hydralazine 10mg  PRN -Patient is compliant with above medications and reports no side effects. Yes -Checking BP at home (average): yes -Highest BP at home: 200/100 -Lowest BP at home: 130/70 -Denies any SOB, CP, vision changes, LE edema or symptoms of hypotension. Yes denies -Diet: working on a well balanced low fat diet -Exercise: walks daily  -discussed adjusting medication. She was hoping to cut back on some of her medication. We discussed combine medications into one pill.  She decided to hold off for now.   HLD:  -Medications: lipitor 10mg  daily -Patient is compliant with above medications and reports no side effects. yes -Last lipid panel: 11/27/22  The 10-year ASCVD risk score (Arnett DK, et al., 2019) is: 1.2%   Values used to calculate the score:     Age: 43 years     Sex: Female     Is Non-Hispanic African American: No     Diabetic: No     Tobacco smoker: No     Systolic Blood Pressure: 130 mmHg     Is BP treated: Yes     HDL Cholesterol: 42 mg/dL     Total Cholesterol: 181 mg/dL   Hypothyroidism: -current medication: levothyroxine 150 mcg on Monday, Tuesday and Wednesday.  She takes 137 mcg the rest of the week. She reports her last tsh was two weeks ago and her dose was adjusted.  She is currently being managed by ent but is being referred to endocrinology.   Psoriatic arthritis: - condition is stable at present, will continue to monitor. She is no longer seeing  rheumatology.     10/07/2023    7:52 AM 11/27/2022   12:59 PM 02/13/2022   10:07 AM  Depression screen PHQ 2/9  Decreased Interest 0 0 0  Down, Depressed, Hopeless 0 0 0  PHQ - 2 Score 0 0 0  Altered sleeping   0  Tired, decreased energy   2  Change in appetite   0  Feeling bad or failure about yourself    0  Trouble concentrating   0  Moving slowly or fidgety/restless   0  Suicidal thoughts   0  PHQ-9 Score   2  Difficult doing work/chores   Not difficult at all    Relevant past medical, surgical, family and social history reviewed and updated as indicated. Interim medical history since our last visit reviewed. Allergies and medications reviewed and updated.  Review of Systems Constitutional: Negative for fever or weight change.  Respiratory: Negative for cough and shortness of breath.   Cardiovascular: Negative for chest pain or palpitations.  Gastrointestinal: Negative for abdominal pain, no bowel changes.  Musculoskeletal: Negative for gait problem or joint swelling.  Skin: Negative for rash.  Neurological: Negative for dizziness or headache.  No other specific complaints in a complete review of systems (except as listed in  HPI above).  Per HPI unless specifically indicated above     Objective:    BP 130/70 (Cuff Size: Normal)   Pulse 98   Temp 98.6 F (37 C) (Oral)   Resp 16   Ht 5\' 4"  (1.626 m)   Wt 174 lb 3.2 oz (79 kg)   SpO2 98%   BMI 29.90 kg/m   BP Readings from Last 3 Encounters:  10/07/23 130/70  07/04/23 117/76  03/06/23 124/78     Wt Readings from Last 3 Encounters:  10/07/23 174 lb 3.2 oz (79 kg)  07/04/23 180 lb (81.6 kg)  03/06/23 190 lb (86.2 kg)    Physical Exam Vitals reviewed.  Constitutional:      Appearance: Normal appearance.  HENT:     Head: Normocephalic.  Cardiovascular:     Rate and Rhythm: Normal rate and regular rhythm.  Pulmonary:     Effort: Pulmonary effort is normal.     Breath sounds: Normal breath sounds.   Musculoskeletal:        General: Normal range of motion.  Skin:    General: Skin is warm and dry.  Neurological:     General: No focal deficit present.     Mental Status: She is alert and oriented to person, place, and time. Mental status is at baseline.  Psychiatric:        Mood and Affect: Mood normal.        Behavior: Behavior normal.        Thought Content: Thought content normal.        Judgment: Judgment normal.     Results for orders placed or performed in visit on 07/04/23  Cytology - PAP   Collection Time: 07/04/23  8:36 AM  Result Value Ref Range   High risk HPV Negative    Adequacy      Satisfactory for evaluation; transformation zone component ABSENT.   Diagnosis      - Negative for intraepithelial lesion or malignancy (NILM)   Microorganisms Shift in flora suggestive of bacterial vaginosis    Comment Normal Reference Range HPV - Negative    Last metabolic panel Lab Results  Component Value Date   GLUCOSE 113 (H) 03/06/2023   NA 136 03/06/2023   K 3.9 03/06/2023   CL 104 03/06/2023   CO2 24 03/06/2023   BUN 10 03/06/2023   CREATININE 0.67 03/06/2023   GFRNONAA >60 03/06/2023   CALCIUM 8.7 (L) 03/06/2023   PROT 7.4 11/27/2022   ALBUMIN 4.3 06/05/2022   BILITOT 0.6 11/27/2022   ALKPHOS 69 06/05/2022   AST 17 11/27/2022   ALT 13 11/27/2022   ANIONGAP 8 03/06/2023   Last lipids Lab Results  Component Value Date   CHOL 181 11/27/2022   HDL 42 (L) 11/27/2022   LDLCALC  11/27/2022     Comment:     . LDL cholesterol not calculated. Triglyceride levels greater than 400 mg/dL invalidate calculated LDL results. . Reference range: <100 . Desirable range <100 mg/dL for primary prevention;   <70 mg/dL for patients with CHD or diabetic patients  with > or = 2 CHD risk factors. Marland Kitchen LDL-C is now calculated using the Martin-Hopkins  calculation, which is a validated novel method providing  better accuracy than the Friedewald equation in the  estimation of  LDL-C.  Horald Pollen et al. Lenox Ahr. 1610;960(45): 2061-2068  (http://education.[employer].com/faq/FAQ164)    TRIG 411 (H) 11/27/2022   CHOLHDL 4.3 11/27/2022   Last hemoglobin A1c Lab Results  Component  Value Date   HGBA1C 5.4 11/27/2022        Assessment & Plan:   Problem List Items Addressed This Visit       Cardiovascular and Mediastinum   Essential hypertension - Primary   Currently monitoring BPs at home with a baseline of 130s/70s. Denies any recent episodes of HTN, stable BP during today's visit, will refill hydrochlorothiazide today. Will continue current medications and dosage: Amlodipine 5mg  daily, hydrochlorothiazide 25mg  daily, valsartan 80mg  daily, and hydralazine 10mg  PRN for elevated BPs greater than 160/100.      Relevant Orders   CBC with Differential/Platelet   COMPLETE METABOLIC PANEL WITH GFR     Endocrine   Hashimoto's disease   Currently taking levothyroxine 150 mcg Monday, Tuesday and Wednesday,  and 137 mcg the rest of the week. followed by ENT, being referred to endo         Musculoskeletal and Integument   Psoriatic arthritis (HCC)   Stable, not currently on treatment.         Other   Class 1 obesity due to excess calories with serious comorbidity and body mass index (BMI) of 33.0 to 33.9 in adult   Pt has lost an average of 20 lbs over the last few months, will continue working on a well balanced diet. Currently taken semaglutide 1mg  wkly.  Comorbidities: HTN, HLD, Hashimoto's disease      Mixed hyperlipidemia   Currently taking lipitor 10mg  daily, labs pending      Relevant Orders   Lipid panel   Other Visit Diagnoses       Screening for diabetes mellitus       Relevant Orders   Hemoglobin A1c      Follow up plan: Return in about 6 months (around 04/05/2024) for follow up.

## 2023-10-07 NOTE — Assessment & Plan Note (Addendum)
Currently taking levothyroxine 150 mcg Monday, Tuesday and Wednesday,  and 137 mcg the rest of the week. followed by ENT, being referred to endo

## 2023-10-07 NOTE — Assessment & Plan Note (Signed)
Pt has lost an average of 20 lbs over the last few months, will continue working on a well balanced diet. Currently taken semaglutide 1mg  wkly.  Comorbidities: HTN, HLD, Hashimoto's disease

## 2023-10-08 ENCOUNTER — Other Ambulatory Visit: Payer: Self-pay | Admitting: Nurse Practitioner

## 2023-10-08 ENCOUNTER — Encounter: Payer: Self-pay | Admitting: Nurse Practitioner

## 2023-10-08 DIAGNOSIS — I10 Essential (primary) hypertension: Secondary | ICD-10-CM

## 2023-10-08 DIAGNOSIS — E782 Mixed hyperlipidemia: Secondary | ICD-10-CM

## 2023-10-08 LAB — HEMOGLOBIN A1C
Hgb A1c MFr Bld: 5.1 %{Hb} (ref <5.7)
Mean Plasma Glucose: 100 mg/dL
eAG (mmol/L): 5.5 mmol/L

## 2023-10-08 LAB — LIPID PANEL
Cholesterol: 122 mg/dL (ref <200)
HDL: 37 mg/dL — ABNORMAL LOW (ref >=50)
LDL Cholesterol (Calc): 62 mg/dL
Non-HDL Cholesterol (Calc): 85 mg/dL (ref <130)
Total CHOL/HDL Ratio: 3.3 (calc) (ref <5.0)
Triglycerides: 149 mg/dL (ref <150)

## 2023-10-08 LAB — COMPLETE METABOLIC PANEL WITH GFR
AG Ratio: 1.9 (calc) (ref 1.0–2.5)
ALT: 19 U/L (ref 6–29)
AST: 15 U/L (ref 10–30)
Albumin: 4.2 g/dL (ref 3.6–5.1)
Alkaline phosphatase (APISO): 66 U/L (ref 31–125)
BUN: 9 mg/dL (ref 7–25)
CO2: 26 mmol/L (ref 20–32)
Calcium: 8.7 mg/dL (ref 8.6–10.2)
Chloride: 106 mmol/L (ref 98–110)
Creat: 0.55 mg/dL (ref 0.50–0.99)
Globulin: 2.2 g/dL (ref 1.9–3.7)
Glucose, Bld: 83 mg/dL (ref 65–99)
Potassium: 3.8 mmol/L (ref 3.5–5.3)
Sodium: 140 mmol/L (ref 135–146)
Total Bilirubin: 0.5 mg/dL (ref 0.2–1.2)
Total Protein: 6.4 g/dL (ref 6.1–8.1)
eGFR: 118 mL/min/{1.73_m2} (ref >=60)

## 2023-10-08 LAB — CBC WITH DIFFERENTIAL/PLATELET
Absolute Lymphocytes: 2063 {cells}/uL (ref 850–3900)
Absolute Monocytes: 503 {cells}/uL (ref 200–950)
Basophils Absolute: 38 {cells}/uL (ref 0–200)
Basophils Relative: 0.5 %
Eosinophils Absolute: 98 {cells}/uL (ref 15–500)
Eosinophils Relative: 1.3 %
HCT: 37.2 % (ref 35.0–45.0)
Hemoglobin: 12.6 g/dL (ref 11.7–15.5)
MCH: 30.4 pg (ref 27.0–33.0)
MCHC: 33.9 g/dL (ref 32.0–36.0)
MCV: 89.6 fL (ref 80.0–100.0)
MPV: 11.3 fL (ref 7.5–12.5)
Monocytes Relative: 6.7 %
Neutro Abs: 4800 {cells}/uL (ref 1500–7800)
Neutrophils Relative %: 64 %
Platelets: 259 10*3/uL (ref 140–400)
RBC: 4.15 10*6/uL (ref 3.80–5.10)
RDW: 12.1 % (ref 11.0–15.0)
Total Lymphocyte: 27.5 %
WBC: 7.5 10*3/uL (ref 3.8–10.8)

## 2023-10-08 MED ORDER — AMLODIPINE BESYLATE 5 MG PO TABS: 5.0000 mg | ORAL_TABLET | Freq: Every day | ORAL | 3 refills | Status: DC

## 2023-10-08 MED ORDER — ATORVASTATIN CALCIUM 10 MG PO TABS: 10.0000 mg | ORAL_TABLET | Freq: Every day | ORAL | 3 refills | Status: AC

## 2023-10-08 MED ORDER — VALSARTAN 80 MG PO TABS: 80.0000 mg | ORAL_TABLET | Freq: Every day | ORAL | 3 refills | Status: DC

## 2023-10-08 MED ORDER — HYDROCHLOROTHIAZIDE 25 MG PO TABS: 25.0000 mg | ORAL_TABLET | Freq: Every day | ORAL | 3 refills | Status: DC

## 2023-10-08 MED ORDER — HYDRALAZINE HCL 10 MG PO TABS: 10.0000 mg | ORAL_TABLET | Freq: Three times a day (TID) | ORAL | 0 refills | Status: DC | PRN

## 2023-10-09 NOTE — Telephone Encounter (Signed)
[facility] called and spoke to [employer] about the refill(s) hydrochlorothiazide 25 mg requested. Advised it was sent on 10/08/23  #90/3 refill(s). She states it was received and is ready for pick up. Will refuse request.    Disp Refills Start End   hydrochlorothiazide (HYDRODIURIL) 25 MG tablet 90 tablet 3 10/08/2023 --   Sig - Route: Take 1 tablet (25 mg total) by mouth daily. - Oral   Sent to pharmacy as: hydrochlorothiazide (HYDRODIURIL) 25 MG tablet   Notes to Pharmacy: Courtesy refill  - Patient will need an office visit for further refills.   E-Prescribing Status: Receipt confirmed by pharmacy (10/08/2023  9:48 AM EST)    Requested Prescriptions  Pending Prescriptions Disp Refills   hydrochlorothiazide (HYDRODIURIL) 25 MG tablet [Pharmacy Med Name: hydroCHLOROthiazide 25 MG Oral Tablet] 30 tablet 0    Sig: TAKE 1 TABLET BY MOUTH ONCE DAILY . APPOINTMENT REQUIRED FOR FUTURE REFILLS     Cardiovascular: Diuretics - Thiazide Failed - 10/09/2023 10:50 AM      Failed - Valid encounter within last 6 months    Recent Outpatient Visits           10 months ago Essential hypertension   [place] [facility] Berniece Salines, FNP   1 year ago Annual physical exam   [place] [facility] Smitty Cords, DO   1 year ago Synovitis of left hand   [place] [facility] Smitty Cords, DO   2 years ago Annual physical exam   [facility] Smitty Cords, DO   3 years ago Essential hypertension   [place] [facility] Smitty Cords, DO              Passed - Cr in normal range and within 180 days    Creat  Date Value Ref Range Status  10/07/2023 0.55 0.50 - 0.99 mg/dL Final         Passed - K in normal range and within 180 days    Potassium  Date Value Ref Range Status  10/07/2023 3.8 3.5 - 5.3 mmol/L Final         Passed - Na in normal  range and within 180 days    Sodium  Date Value Ref Range Status  10/07/2023 140 135 - 146 mmol/L Final         Passed - Last BP in normal range    BP Readings from Last 1 Encounters:  10/07/23 130/70

## 2023-10-23 ENCOUNTER — Other Ambulatory Visit: Payer: Self-pay | Admitting: Nurse Practitioner

## 2023-10-23 DIAGNOSIS — E66811 Obesity, class 1: Secondary | ICD-10-CM

## 2023-10-23 MED ORDER — SEMAGLUTIDE (1 MG/DOSE) 4 MG/3ML ~~LOC~~ SOPN: 1.0000 mg | PEN_INJECTOR | SUBCUTANEOUS | 0 refills | Status: DC

## 2023-11-18 ENCOUNTER — Other Ambulatory Visit: Payer: Self-pay | Admitting: Nurse Practitioner

## 2023-11-18 DIAGNOSIS — E66811 Obesity, class 1: Secondary | ICD-10-CM

## 2023-11-18 MED ORDER — SEMAGLUTIDE (1 MG/DOSE) 4 MG/3ML ~~LOC~~ SOPN: 1.0000 mg | PEN_INJECTOR | SUBCUTANEOUS | 0 refills | Status: DC

## 2024-01-01 ENCOUNTER — Other Ambulatory Visit: Payer: Self-pay | Admitting: Nurse Practitioner

## 2024-01-01 DIAGNOSIS — E6609 Other obesity due to excess calories: Secondary | ICD-10-CM

## 2024-01-01 MED ORDER — SEMAGLUTIDE (1 MG/DOSE) 4 MG/3ML ~~LOC~~ SOPN: 1.0000 mg | PEN_INJECTOR | SUBCUTANEOUS | 0 refills | Status: DC

## 2024-01-07 ENCOUNTER — Encounter (INDEPENDENT_AMBULATORY_CARE_PROVIDER_SITE_OTHER): Payer: Self-pay

## 2024-02-17 ENCOUNTER — Other Ambulatory Visit: Payer: Self-pay | Admitting: Nurse Practitioner

## 2024-02-17 ENCOUNTER — Encounter: Payer: Self-pay | Admitting: Nurse Practitioner

## 2024-02-17 DIAGNOSIS — E66811 Obesity, class 1: Secondary | ICD-10-CM

## 2024-02-17 MED ORDER — SEMAGLUTIDE (1 MG/DOSE) 4 MG/3ML ~~LOC~~ SOPN: 1.0000 mg | PEN_INJECTOR | SUBCUTANEOUS | 0 refills | Status: DC

## 2024-04-17 ENCOUNTER — Other Ambulatory Visit: Payer: Self-pay

## 2024-04-17 ENCOUNTER — Encounter: Payer: Self-pay | Admitting: Nurse Practitioner

## 2024-04-17 ENCOUNTER — Ambulatory Visit: Payer: Self-pay | Admitting: Nurse Practitioner

## 2024-04-17 VITALS — BP 118/72 | HR 91 | Temp 98.3°F | Resp 16 | Ht 64.0 in | Wt 155.4 lb

## 2024-04-17 DIAGNOSIS — E063 Autoimmune thyroiditis: Secondary | ICD-10-CM

## 2024-04-17 DIAGNOSIS — I10 Essential (primary) hypertension: Secondary | ICD-10-CM

## 2024-04-17 DIAGNOSIS — Z6833 Body mass index (BMI) 33.0-33.9, adult: Secondary | ICD-10-CM

## 2024-04-17 DIAGNOSIS — E782 Mixed hyperlipidemia: Secondary | ICD-10-CM

## 2024-04-17 DIAGNOSIS — E66811 Obesity, class 1: Secondary | ICD-10-CM

## 2024-04-17 DIAGNOSIS — E6609 Other obesity due to excess calories: Secondary | ICD-10-CM

## 2024-04-17 MED ORDER — SEMAGLUTIDE (1 MG/DOSE) 4 MG/3ML ~~LOC~~ SOPN: 1.0000 mg | PEN_INJECTOR | SUBCUTANEOUS | 1 refills | Status: AC

## 2024-04-17 MED ORDER — HYDRALAZINE HCL 10 MG PO TABS: 10.0000 mg | ORAL_TABLET | Freq: Three times a day (TID) | ORAL | 0 refills | Status: DC | PRN

## 2024-04-17 MED ORDER — HYDROCHLOROTHIAZIDE 25 MG PO TABS: 25.0000 mg | ORAL_TABLET | Freq: Every day | ORAL | 0 refills | Status: AC

## 2024-04-17 NOTE — Assessment & Plan Note (Signed)
 Continuing limiting saturated fats. Continue taking Atorvastatin  10 mg once daily. Plan to check lipid levels at next visit.

## 2024-04-17 NOTE — Assessment & Plan Note (Addendum)
 BP today was 118/72. Pt no longer taking Amlodipine  5mg . Continue taking Hydrochlorothiazide  25mg  daily and begin taking only half of the Valsartan  80mg  (to make 40mg ). Follow this plan for 1 week and record blood pressures. If BP is stable then plan to come off of the Valsartan  completely and just continue with Hydrochlorothiazide  25mg .

## 2024-04-17 NOTE — Progress Notes (Signed)
 BP 118/72 (Cuff Size: Large)   Pulse 91   Temp 98.3 F (36.8 C) (Oral)   Resp 16   Ht 5' 4 (1.626 m)   Wt 155 lb 6.4 oz (70.5 kg)   SpO2 98%   BMI 26.67 kg/m    Subjective:    Patient ID: Jacqueline Walsh, female    DOB: Aug 21, 1980, 43 y.o.   MRN: [id number]  HPI: Jacqueline Walsh is a 43 y.o. female presenting today for medication follow-up:  Class 1 Obesity:  -Taking Semaglutide  1mg  and reports no side effects. Needs a refill today. -Weight today was 155 lbs, which is down from 174 lbs in February 2025.  -BMI was 26.67  Low Blood pressure: -Reports stopped taking Amlodipine  and Hydralazine  due to BP running lower at home 108/70's  -Reports feeling lightheaded in the mornings and when going from sitting to standing. Checking orthostatics today. -Reports only taking valsartan  80mg  daily and hydrochlorothiazide  25 mg daily  -Patient interested in coming off of some of the blood pressure medications she's on  -BP today 118/72    Orthostatic VS for the past 72 hrs (Last 3 readings):  Orthostatic BP Patient Position BP Location Cuff Size Orthostatic Pulse  04/17/24 1507 112/82 Standing Right Arm Normal 79  04/17/24 1506 112/70 Sitting Right Arm Normal 86  04/17/24 1505 116/70 Supine Right Arm Normal 78     Hashimoto's disease: managed by endocrinology,  recent TSH normal currently on levothyroxine  150 mcg daily.  Doing well     10/07/2023    7:52 AM 11/27/2022   12:59 PM 02/13/2022   10:07 AM  Depression screen PHQ 2/9  Decreased Interest 0 0 0  Down, Depressed, Hopeless 0 0 0  PHQ - 2 Score 0 0 0  Altered sleeping   0  Tired, decreased energy   2  Change in appetite   0  Feeling bad or failure about yourself    0  Trouble concentrating   0  Moving slowly or fidgety/restless   0  Suicidal thoughts   0  PHQ-9 Score   2  Difficult doing work/chores   Not difficult at all    Relevant past medical, surgical, family and social history reviewed and updated as indicated.  Interim medical history since our last visit reviewed. Allergies and medications reviewed and updated.  Review of Systems Constitutional: Negative for fever or weight change.  Respiratory: Negative for cough and shortness of breath.   Cardiovascular: Negative for chest pain or palpitations.  Gastrointestinal: Negative for abdominal pain, no bowel changes.  Musculoskeletal: Negative for gait problem or joint swelling.  Skin: Negative for rash.  Neurological: Positive for lightheadedness.Negative for headache  No other specific complaints in a complete review of systems (except as listed in HPI above).      Objective:     BP 118/72 (Cuff Size: Large)   Pulse 91   Temp 98.3 F (36.8 C) (Oral)   Resp 16   Ht 5' 4 (1.626 m)   Wt 155 lb 6.4 oz (70.5 kg)   SpO2 98%   BMI 26.67 kg/m    Wt Readings from Last 3 Encounters:  04/17/24 155 lb 6.4 oz (70.5 kg)  10/07/23 174 lb 3.2 oz (79 kg)  07/04/23 180 lb (81.6 kg)    Physical Exam Constitutional:      Appearance: Normal appearance.  HENT:     Head: Normocephalic and atraumatic.  Cardiovascular:     Rate and Rhythm: Normal  rate and regular rhythm.     Pulses: Normal pulses.     Heart sounds: Normal heart sounds.  Pulmonary:     Effort: Pulmonary effort is normal.     Breath sounds: Normal breath sounds.  Skin:    General: Skin is warm and dry.  Neurological:     General: No focal deficit present.     Mental Status: She is alert and oriented to person, place, and time.  Psychiatric:        Mood and Affect: Mood normal.        Behavior: Behavior normal.        Thought Content: Thought content normal.        Judgment: Judgment normal.      Results for orders placed or performed in visit on 10/07/23  CBC with Differential/Platelet   Collection Time: 10/07/23  8:25 AM  Result Value Ref Range   WBC 7.5 3.8 - 10.8 Thousand/uL   RBC 4.15 3.80 - 5.10 Million/uL   Hemoglobin 12.6 11.7 - 15.5 g/dL   HCT 62.7 64.9 - 54.9 %    MCV 89.6 80.0 - 100.0 fL   MCH 30.4 27.0 - 33.0 pg   MCHC 33.9 32.0 - 36.0 g/dL   RDW 87.8 88.9 - 84.9 %   Platelets 259 140 - 400 Thousand/uL   MPV 11.3 7.5 - 12.5 fL   Neutro Abs 4,800 1,500 - 7,800 cells/uL   Absolute Lymphocytes 2,063 850 - 3,900 cells/uL   Absolute Monocytes 503 200 - 950 cells/uL   Eosinophils Absolute 98 15 - 500 cells/uL   Basophils Absolute 38 0 - 200 cells/uL   Neutrophils Relative % 64 %   Total Lymphocyte 27.5 %   Monocytes Relative 6.7 %   Eosinophils Relative 1.3 %   Basophils Relative 0.5 %  COMPLETE METABOLIC PANEL WITH GFR   Collection Time: 10/07/23  8:25 AM  Result Value Ref Range   Glucose, Bld 83 65 - 99 mg/dL   BUN 9 7 - 25 mg/dL   Creat 9.44 9.49 - 9.00 mg/dL   eGFR 881 > OR = 60 fO/fpw/8.26f7   BUN/Creatinine Ratio SEE NOTE: 6 - 22 (calc)   Sodium 140 135 - 146 mmol/L   Potassium 3.8 3.5 - 5.3 mmol/L   Chloride 106 98 - 110 mmol/L   CO2 26 20 - 32 mmol/L   Calcium  8.7 8.6 - 10.2 mg/dL   Total Protein 6.4 6.1 - 8.1 g/dL   Albumin 4.2 3.6 - 5.1 g/dL   Globulin 2.2 1.9 - 3.7 g/dL (calc)   AG Ratio 1.9 1.0 - 2.5 (calc)   Total Bilirubin 0.5 0.2 - 1.2 mg/dL   Alkaline phosphatase (APISO) 66 31 - 125 U/L   AST 15 10 - 30 U/L   ALT 19 6 - 29 U/L  Lipid panel   Collection Time: 10/07/23  8:25 AM  Result Value Ref Range   Cholesterol 122 <200 mg/dL   HDL 37 (L) > OR = 50 mg/dL   Triglycerides 850 <849 mg/dL   LDL Cholesterol (Calc) 62 mg/dL (calc)   Total CHOL/HDL Ratio 3.3 <5.0 (calc)   Non-HDL Cholesterol (Calc) 85 <869 mg/dL (calc)  Hemoglobin J8r   Collection Time: 10/07/23  8:25 AM  Result Value Ref Range   Hgb A1c MFr Bld 5.1 <5.7 % of total Hgb   Mean Plasma Glucose 100 mg/dL   eAG (mmol/L) 5.5 mmol/L          Assessment &  Plan:   Problem List Items Addressed This Visit       Cardiovascular and Mediastinum   Essential hypertension   BP today was 118/72. Pt no longer taking Amlodipine  5mg . Continue taking  Hydrochlorothiazide  25mg  daily and begin taking only half of the Valsartan  80mg  (to make 40mg ). Follow this plan for 1 week and record blood pressures. If BP is stable then plan to come off of the Valsartan  completely and just continue with Hydrochlorothiazide  25mg .       Relevant Medications   hydrochlorothiazide  (HYDRODIURIL ) 25 MG tablet     Endocrine   Hashimoto's disease   managed by endocrinology,  recent TSH normal currently on levothyroxine  150 mcg daily.  Doing well        Other   Class 1 obesity due to excess calories with serious comorbidity and body mass index (BMI) of 33.0 to 33.9 in adult - Primary   Continue taking the Semaglutide  1 mg. Refill sent to pharmacy. Weight today 155 lb with a BMI of 26.67. Continue with lifestyle modifications including and diet and exercise.       Relevant Medications   Semaglutide , 1 MG/DOSE, 4 MG/3ML SOPN   Mixed hyperlipidemia   Continuing limiting saturated fats. Continue taking Atorvastatin  10 mg once daily. Plan to check lipid levels at next visit.       Relevant Medications   hydrochlorothiazide  (HYDRODIURIL ) 25 MG tablet    -Pick up refills at pharmacy (Hydrochlorothiazide  25mg  and Semaglutide  1 mg) -Continue taking Hydrochlorothiazide  25mg  daily and start taking only HALF of the Valsartan  80mg  (to make 40mg ). Do this for one week and record blood pressures. If blood pressures are stable then we can come off of the Valsartan . -Continue taking Atorvastatin  10 mg daily  -Continue with lifestyle modifications including diet and exercise.          Follow up plan: Return in about 6 months (around 10/17/2024) for cpe.   I have reviewed this encounter including the documentation in this note and/or discussed this patient with the provider, Aislinn Womack, SNP, I am certifying that I agree with the content of this note as supervising/preceptor nurse practitioner.  Mliss Spray, FNP-C [facility] 04/17/2024, 3:48 PM

## 2024-04-17 NOTE — Assessment & Plan Note (Signed)
 managed by endocrinology,  recent TSH normal currently on levothyroxine  150 mcg daily.  Doing well

## 2024-04-17 NOTE — Assessment & Plan Note (Addendum)
 Continue taking the Semaglutide  1 mg. Refill sent to pharmacy. Weight today 155 lb with a BMI of 26.67. Continue with lifestyle modifications including and diet and exercise.

## 2024-06-24 ENCOUNTER — Encounter: Payer: Self-pay | Admitting: Nurse Practitioner

## 2024-06-24 ENCOUNTER — Other Ambulatory Visit: Payer: Self-pay | Admitting: Nurse Practitioner

## 2024-06-24 DIAGNOSIS — I10 Essential (primary) hypertension: Secondary | ICD-10-CM

## 2024-06-24 MED ORDER — VALSARTAN 40 MG PO TABS: 40.0000 mg | ORAL_TABLET | Freq: Every day | ORAL | 3 refills | Status: AC

## 2024-10-23 ENCOUNTER — Encounter: Payer: Self-pay | Admitting: Nurse Practitioner
